# Patient Record
Sex: Male | Born: 1975 | Hispanic: No | Marital: Married | Smoking: Never smoker
Health system: Southern US, Community
[De-identification: ages and names within clinical notes are randomized; demographics above are authoritative.]

## PROBLEM LIST (undated history)

## (undated) DIAGNOSIS — R7303 Prediabetes: Secondary | ICD-10-CM

## (undated) DIAGNOSIS — S99919A Unspecified injury of unspecified ankle, initial encounter: Secondary | ICD-10-CM

## (undated) HISTORY — PX: TONSILLECTOMY: SUR1361

## (undated) HISTORY — DX: Prediabetes: R73.03

## (undated) HISTORY — DX: Unspecified injury of unspecified ankle, initial encounter: S99.919A

---

## 1997-04-30 DIAGNOSIS — S99919A Unspecified injury of unspecified ankle, initial encounter: Secondary | ICD-10-CM

## 1997-04-30 HISTORY — PX: ANKLE SURGERY: SHX546

## 1997-04-30 HISTORY — DX: Unspecified injury of unspecified ankle, initial encounter: S99.919A

## 2016-01-30 ENCOUNTER — Encounter: Payer: Self-pay | Admitting: Emergency Medicine

## 2016-01-30 ENCOUNTER — Ambulatory Visit
Admission: EM | Admit: 2016-01-30 | Discharge: 2016-01-30 | Disposition: A | Discharge status: Home / Self Care | Payer: Self-pay | Attending: Family Medicine | Admitting: Family Medicine

## 2016-01-30 DIAGNOSIS — Z0289 Encounter for other administrative examinations: Secondary | ICD-10-CM

## 2016-01-30 DIAGNOSIS — G4489 Other headache syndrome: Secondary | ICD-10-CM

## 2016-01-30 DIAGNOSIS — R600 Localized edema: Secondary | ICD-10-CM

## 2016-01-30 LAB — CBC WITH DIFFERENTIAL/PLATELET
Basophils Absolute: 0 10*3/uL (ref 0–0.1)
Basophils Relative: 1 %
EOS PCT: 2 %
Eosinophils Absolute: 0.1 10*3/uL (ref 0–0.7)
HCT: 44.3 % (ref 40.0–52.0)
Hemoglobin: 15 g/dL (ref 13.0–18.0)
LYMPHS ABS: 2.3 10*3/uL (ref 1.0–3.6)
LYMPHS PCT: 33 %
MCH: 30 pg (ref 26.0–34.0)
MCHC: 33.9 g/dL (ref 32.0–36.0)
MCV: 88.5 fL (ref 80.0–100.0)
MONO ABS: 0.8 10*3/uL (ref 0.2–1.0)
MONOS PCT: 12 %
Neutro Abs: 3.7 10*3/uL (ref 1.4–6.5)
Neutrophils Relative %: 52 %
PLATELETS: 285 10*3/uL (ref 150–440)
RBC: 5.01 MIL/uL (ref 4.40–5.90)
RDW: 13.5 % (ref 11.5–14.5)
WBC: 7 10*3/uL (ref 3.8–10.6)

## 2016-01-30 LAB — BASIC METABOLIC PANEL
Anion gap: 6 (ref 5–15)
BUN: 15 mg/dL (ref 6–20)
CO2: 24 mmol/L (ref 22–32)
CREATININE: 0.89 mg/dL (ref 0.61–1.24)
Calcium: 9.3 mg/dL (ref 8.9–10.3)
Chloride: 104 mmol/L (ref 101–111)
GFR calc Af Amer: >60 mL/min (ref >60)
GLUCOSE: 123 mg/dL — AB (ref 65–99)
POTASSIUM: 3.9 mmol/L (ref 3.5–5.1)
Sodium: 134 mmol/L — ABNORMAL LOW (ref 135–145)

## 2016-01-30 LAB — DEPT OF TRANSP DIPSTICK, URINE (ARMC ONLY)
Glucose, UA: NEGATIVE mg/dL
HGB URINE DIPSTICK: NEGATIVE
Protein, ur: NEGATIVE mg/dL
Specific Gravity, Urine: 1.02 (ref 1.005–1.030)

## 2016-01-30 NOTE — ED Provider Notes (Signed)
  [facility]    CSN: [id number] Arrival date & time: 01/30/16  1229     History   Chief Complaint Chief Complaint  Patient presents with  . Commercial Driver's License Exam    HPI Caleb Duncan is a 40 y.o. male.   Patient here for DOT Physical (see scanned form)   The history is provided by the patient.    History reviewed. No pertinent past medical history.  There are no active problems to display for this patient.   Past Surgical History:  Procedure Laterality Date  . TONSILLECTOMY         Home Medications    Prior to Admission medications   Not on File    Family History History reviewed. No pertinent family history.  Social History Social History  Substance Use Topics  . Smoking status: Former Games developermoker  . Smokeless tobacco: Never Used  . Alcohol use Yes     Allergies   Review of patient's allergies indicates no known allergies.   Review of Systems Review of Systems   Physical Exam Triage Vital Signs ED Triage Vitals  Enc Vitals Group     BP 01/30/16 1258 138/86     Pulse Rate 01/30/16 1258 70     Resp 01/30/16 1258 18     Temp 01/30/16 1258 97.4 F (36.3 C)     Temp Source 01/30/16 1258 Tympanic     SpO2 01/30/16 1258 99 %     Weight 01/30/16 1259 252 lb (114.3 kg)     Height 01/30/16 1259 5\' 6"  (1.676 m)     Head Circumference --      Peak Flow --      Pain Score 01/30/16 1259 0     Pain Loc --      Pain Edu? --      Excl. in GC? --    No data found.   Updated Vital Signs BP 138/86 (BP Location: Left Arm)   Pulse 70   Temp 97.4 F (36.3 C) (Tympanic)   Resp 18   Ht 5\' 6"  (1.676 m)   Wt 252 lb (114.3 kg)   SpO2 99%   BMI 40.67 kg/m   Visual Acuity Right Eye Distance: 20/13 Left Eye Distance: 20/13 Bilateral Distance: 20/13  Right Eye Near:   Left Eye Near:    Bilateral Near:     Physical Exam   UC Treatments / Results  Labs (all labs ordered are listed, but only abnormal results are  displayed) Labs Reviewed  DEPT OF TRANSP DIPSTICK, URINE([facility] ONLY)    EKG  EKG Interpretation None       Radiology No results found.  Procedures Procedures (including critical care time)  Medications Ordered in UC Medications - No data to display   Initial Impression / Assessment and Plan / UC Course  I have reviewed the triage vital signs and the nursing notes.  Pertinent labs & imaging results that were available during my care of the patient were reviewed by me and considered in my medical decision making (see chart for details).  Clinical Course      Final Clinical Impressions(s) / UC Diagnoses   Final diagnoses:  Encounter for examination required by Department of Transportation (DOT)    New Prescriptions There are no discharge medications for this patient.   DOT Physical (medically qualified for 2 year; see scanned form)   Payton Mccallumrlando Hayleen Clinkscales, MD 01/30/16 1328

## 2016-01-30 NOTE — ED Provider Notes (Signed)
[facility]    CSN: [id number] Arrival date & time: 01/30/16  1014     History   Chief Complaint Chief Complaint  Patient presents with  . Headache    HPI Caleb Duncan is a 40 y.o. male.   40 yo male with a  c/o bilateral ankle swelling off and on for over a week and HA for couple of days. States has a family history of diabetes and high blood pressure. Also states he eats salty foods. Denies any chest pains, shortness of breath, palpitations, vision changes, injuries, numbness/tingling.     Headache    History reviewed. No pertinent past medical history.  There are no active problems to display for this patient.   Past Surgical History:  Procedure Laterality Date  . TONSILLECTOMY         Home Medications    Prior to Admission medications   Not on File    Family History History reviewed. No pertinent family history.  Social History Social History  Substance Use Topics  . Smoking status: Former Games developer  . Smokeless tobacco: Never Used  . Alcohol use Yes     Allergies   Review of patient's allergies indicates no known allergies.   Review of Systems Review of Systems  Neurological: Positive for headaches.     Physical Exam Triage Vital Signs ED Triage Vitals  Enc Vitals Group     BP 01/30/16 1115 138/87     Pulse Rate 01/30/16 1115 70     Resp 01/30/16 1115 16     Temp 01/30/16 1115 97.4 F (36.3 C)     Temp Source 01/30/16 1115 Tympanic     SpO2 01/30/16 1115 99 %     Weight --      Height 01/30/16 1113 5\' 6"  (1.676 m)     Head Circumference --      Peak Flow --      Pain Score 01/30/16 1114 0     Pain Loc --      Pain Edu? --      Excl. in GC? --    No data found.   Updated Vital Signs BP 138/87 (BP Location: Right Arm)   Pulse 70   Temp 97.4 F (36.3 C) (Tympanic)   Resp 16   Ht 5\' 6"  (1.676 m)   SpO2 99%   Visual Acuity Right Eye Distance:   Left Eye Distance:   Bilateral Distance:    Right Eye  Near:   Left Eye Near:    Bilateral Near:     Physical Exam  Constitutional: He appears well-developed and well-nourished. No distress.  HENT:  Head: Normocephalic and atraumatic.  Right Ear: Tympanic membrane, external ear and ear canal normal.  Left Ear: Tympanic membrane, external ear and ear canal normal.  Nose: Nose normal.  Mouth/Throat: Uvula is midline, oropharynx is clear and moist and mucous membranes are normal. No oropharyngeal exudate or tonsillar abscesses.  Eyes: Conjunctivae and EOM are normal. Pupils are equal, round, and reactive to light. Right eye exhibits no discharge. Left eye exhibits no discharge. No scleral icterus.  Neck: Normal range of motion. Neck supple. No tracheal deviation present. No thyromegaly present.  Cardiovascular: Normal rate, regular rhythm and normal heart sounds.   Pulmonary/Chest: Effort normal and breath sounds normal. No stridor. No respiratory distress. He has no wheezes. He has no rales. He exhibits no tenderness.  Musculoskeletal: He exhibits edema (mild, trace-1+ tibial and pedal edema).  Lymphadenopathy:  He has no cervical adenopathy.  Neurological: He is alert.  Skin: Skin is warm and dry. No rash noted. He is not diaphoretic.  Nursing note and vitals reviewed.    UC Treatments / Results  Labs (all labs ordered are listed, but only abnormal results are displayed) Labs Reviewed  BASIC METABOLIC PANEL - Abnormal; Notable for the following:       Result Value   Sodium 134 (*)    Glucose, Bld 123 (*)    All other components within normal limits  CBC WITH DIFFERENTIAL/PLATELET    EKG  EKG Interpretation None       Radiology No results found.  Procedures Procedures (including critical care time)  Medications Ordered in UC Medications - No data to display   Initial Impression / Assessment and Plan / UC Course  I have reviewed the triage vital signs and the nursing notes.  Pertinent labs & imaging results that  were available during my care of the patient were reviewed by me and considered in my medical decision making (see chart for details).  Clinical Course      Final Clinical Impressions(s) / UC Diagnoses   Final diagnoses:  Other headache syndrome  Leg edema    New Prescriptions There are no discharge medications for this patient.  1. Lab results (normal/negative) and diagnosis reviewed with patient 2. Recommend supportive treatment with decrease or elimination of salty foods; increase water intake 3. Establish care with pcp 4. Follow-up prn if symptoms worsen or don't improve   Payton Mccallumrlando Marykathryn Carboni, MD 01/30/16 1426

## 2016-01-30 NOTE — ED Triage Notes (Signed)
Patient c/o bilateral ankle swelling off and on for over a week and HA for couple of days.

## 2016-01-30 NOTE — ED Triage Notes (Signed)
DOT Physical

## 2016-02-01 ENCOUNTER — Telehealth: Payer: Self-pay

## 2016-02-01 NOTE — Telephone Encounter (Signed)
Courtesy call back completed today after patient's visit at [facility]. Patient improved and will call back with any questions or concerns.  

## 2017-08-22 ENCOUNTER — Encounter: Payer: Self-pay | Admitting: Internal Medicine

## 2017-08-23 ENCOUNTER — Encounter: Payer: Self-pay | Admitting: Internal Medicine

## 2017-08-23 ENCOUNTER — Ambulatory Visit: Payer: Managed Care, Other (non HMO) | Admitting: Internal Medicine

## 2017-08-23 VITALS — BP 122/89 | HR 76 | Resp 16 | Ht 65.0 in | Wt 264.0 lb

## 2017-08-23 DIAGNOSIS — R350 Frequency of micturition: Secondary | ICD-10-CM | POA: Diagnosis not present

## 2017-08-23 DIAGNOSIS — M5430 Sciatica, unspecified side: Secondary | ICD-10-CM | POA: Insufficient documentation

## 2017-08-23 DIAGNOSIS — N529 Male erectile dysfunction, unspecified: Secondary | ICD-10-CM | POA: Insufficient documentation

## 2017-08-23 DIAGNOSIS — K219 Gastro-esophageal reflux disease without esophagitis: Secondary | ICD-10-CM | POA: Diagnosis not present

## 2017-08-23 LAB — POCT URINALYSIS DIPSTICK
BILIRUBIN UA: NEGATIVE
GLUCOSE UA: NEGATIVE
KETONES UA: NEGATIVE
Leukocytes, UA: NEGATIVE
Nitrite, UA: NEGATIVE
Protein, UA: NEGATIVE
RBC UA: NEGATIVE
SPEC GRAV UA: 1.025 (ref 1.010–1.025)
Urobilinogen, UA: 0.2 E.U./dL
pH, UA: 6 (ref 5.0–8.0)

## 2017-08-23 MED ORDER — SILDENAFIL CITRATE 20 MG PO TABS: 20.0000 mg | ORAL_TABLET | Freq: Every day | ORAL | 0 refills | Status: DC | PRN

## 2017-08-23 NOTE — Progress Notes (Signed)
Date:  08/23/2017   Name:  Caleb Duncan   DOB:  Jul 16, 1975   MRN:  [id number]   Chief Complaint: Establish Care and Testicle Pain (2-3 mo ago had back pain for few days and testicle pain. )  Had a day and one half of testicular pain and low back pain.  His testicles felt swollen but symptoms resolved without treatment. He concerned about urinary frequency, urgency and mild ED that is intermittent.  No blood in his urine has been seen.  No trauma.  Review of Systems  Constitutional: Negative for chills, fatigue and fever.  Respiratory: Negative for chest tightness, shortness of breath and wheezing.   Cardiovascular: Negative for chest pain and palpitations.  Gastrointestinal: Negative for constipation and diarrhea.       Reflux with fatty or acidic foods   Genitourinary: Positive for frequency, testicular pain and urgency. Negative for difficulty urinating, dysuria, hematuria, penile swelling and scrotal swelling.  Musculoskeletal: Positive for back pain.  Neurological: Negative for dizziness and headaches.  Psychiatric/Behavioral: Negative for sleep disturbance.    There are no active problems to display for this patient.   Prior to Admission medications   Not on File    No Known Allergies  Past Surgical History:  Procedure Laterality Date  . ANKLE SURGERY Right 1999  . TONSILLECTOMY      Social History   Tobacco Use  . Smoking status: Never Smoker  . Smokeless tobacco: Never Used  Substance Use Topics  . Alcohol use: Yes  . Drug use: Yes    Frequency: 1.0 times per week    Types: Marijuana     Medication list has been reviewed and updated.  PHQ 2/9 Scores 08/23/2017  PHQ - 2 Score 0    Physical Exam  Constitutional: He is oriented to person, place, and time. He appears well-developed. No distress.  HENT:  Head: Normocephalic and atraumatic.  Eyes: Pupils are equal, round, and reactive to light.  Neck: Normal range of motion. Neck supple.    Cardiovascular: Normal rate, regular rhythm and normal heart sounds.  Pulmonary/Chest: Effort normal and breath sounds normal. No respiratory distress.  Genitourinary: Rectum normal and prostate normal. Prostate is not tender.  Musculoskeletal: Normal range of motion.  Lymphadenopathy:    He has no cervical adenopathy.  Neurological: He is alert and oriented to person, place, and time.  Skin: Skin is warm and dry. No rash noted.  Psychiatric: He has a normal mood and affect. His behavior is normal. Thought content normal.    BP 122/89   Pulse 76   Resp 16   Ht 5\' 5"  (1.651 m)   Wt 264 lb (119.7 kg)   SpO2 97%   BMI 43.93 kg/m   Assessment and Plan: 1. Gastroesophageal reflux disease, esophagitis presence not specified Continue zantac PRN Avoid fatty foods  2. Urinary frequency UA negative - pt educated about voiding on a schedule - POCT urinalysis dipstick  3. Erectile dysfunction, unspecified erectile dysfunction type Will try sildenafil PRN Consider testosterone level, PSA next visit - sildenafil (REVATIO) 20 MG tablet; Take 1-2 tablets (20-40 mg total) by mouth daily as needed.  Dispense: 30 tablet; Refill: 0  4. Sciatica, unspecified laterality Intermittent - follow up if needed   Meds ordered this encounter  Medications  . sildenafil (REVATIO) 20 MG tablet    Sig: Take 1-2 tablets (20-40 mg total) by mouth daily as needed.    Dispense:  30 tablet    Refill:  0    Partially dictated using Animal nutritionistDragon software. Any errors are unintentional.  Bari EdwardLaura Darletta Noblett, MD [facility]  08/23/2017

## 2017-11-06 ENCOUNTER — Telehealth: Payer: Self-pay | Admitting: General Practice

## 2017-11-06 ENCOUNTER — Other Ambulatory Visit: Payer: Self-pay | Admitting: Internal Medicine

## 2017-11-06 DIAGNOSIS — N529 Male erectile dysfunction, unspecified: Secondary | ICD-10-CM

## 2017-11-06 MED ORDER — SILDENAFIL CITRATE 20 MG PO TABS: 20.0000 mg | ORAL_TABLET | Freq: Every day | ORAL | 5 refills | Status: DC | PRN

## 2017-11-06 NOTE — Telephone Encounter (Signed)
Pt needs refill sent to [facility] [address]    sildenafil (REVATIO) 20 MG tablet [[id number]]

## 2017-12-24 ENCOUNTER — Other Ambulatory Visit: Payer: Self-pay

## 2017-12-24 DIAGNOSIS — N529 Male erectile dysfunction, unspecified: Secondary | ICD-10-CM

## 2017-12-24 MED ORDER — SILDENAFIL CITRATE 20 MG PO TABS: 20.0000 mg | ORAL_TABLET | Freq: Every day | ORAL | 5 refills | Status: DC | PRN

## 2017-12-27 ENCOUNTER — Encounter: Payer: Self-pay | Admitting: Internal Medicine

## 2017-12-27 ENCOUNTER — Ambulatory Visit (INDEPENDENT_AMBULATORY_CARE_PROVIDER_SITE_OTHER): Payer: Managed Care, Other (non HMO) | Admitting: Internal Medicine

## 2017-12-27 VITALS — BP 130/88 | HR 80 | Ht 65.0 in | Wt 244.0 lb

## 2017-12-27 DIAGNOSIS — Z0000 Encounter for general adult medical examination without abnormal findings: Secondary | ICD-10-CM | POA: Diagnosis not present

## 2017-12-27 DIAGNOSIS — Z23 Encounter for immunization: Secondary | ICD-10-CM | POA: Diagnosis not present

## 2017-12-27 DIAGNOSIS — N529 Male erectile dysfunction, unspecified: Secondary | ICD-10-CM

## 2017-12-27 DIAGNOSIS — K219 Gastro-esophageal reflux disease without esophagitis: Secondary | ICD-10-CM | POA: Diagnosis not present

## 2017-12-27 DIAGNOSIS — Z125 Encounter for screening for malignant neoplasm of prostate: Secondary | ICD-10-CM | POA: Diagnosis not present

## 2017-12-27 DIAGNOSIS — N3281 Overactive bladder: Secondary | ICD-10-CM | POA: Diagnosis not present

## 2017-12-27 LAB — POCT URINALYSIS DIPSTICK
BILIRUBIN UA: NEGATIVE
Blood, UA: NEGATIVE
GLUCOSE UA: NEGATIVE
KETONES UA: NEGATIVE
Leukocytes, UA: NEGATIVE
Nitrite, UA: NEGATIVE
Protein, UA: NEGATIVE
SPEC GRAV UA: 1.01 (ref 1.010–1.025)
Urobilinogen, UA: 0.2 E.U./dL
pH, UA: 6 (ref 5.0–8.0)

## 2017-12-27 NOTE — Patient Instructions (Signed)
Tdap Vaccine (Tetanus, Diphtheria and Pertussis): What You Need to Know 1. Why get vaccinated? Tetanus, diphtheria and pertussis are very serious diseases. Tdap vaccine can protect us from these diseases. And, Tdap vaccine given to pregnant women can protect newborn babies against pertussis. TETANUS (Lockjaw) is rare in the [place] today. It causes painful muscle tightening and stiffness, usually all over the body.  It can lead to tightening of muscles in the head and neck so you can't open your mouth, swallow, or sometimes even breathe. Tetanus kills about 1 out of 10 people who are infected even after receiving the best medical care.  DIPHTHERIA is also rare in the [place] today. It can cause a thick coating to form in the back of the throat.  It can lead to breathing problems, heart failure, paralysis, and death.  PERTUSSIS (Whooping Cough) causes severe coughing spells, which can cause difficulty breathing, vomiting and disturbed sleep.  It can also lead to weight loss, incontinence, and rib fractures. Up to 2 in 100 adolescents and 5 in 100 adults with pertussis are hospitalized or have complications, which could include pneumonia or death.  These diseases are caused by bacteria. Diphtheria and pertussis are spread from person to person through secretions from coughing or sneezing. Tetanus enters the body through cuts, scratches, or wounds. Before vaccines, as many as 200,000 cases of diphtheria, 200,000 cases of pertussis, and hundreds of cases of tetanus, were reported in the [place] each year. Since vaccination began, reports of cases for tetanus and diphtheria have dropped by about 99% and for pertussis by about 80%. 2. Tdap vaccine Tdap vaccine can protect adolescents and adults from tetanus, diphtheria, and pertussis. One dose of Tdap is routinely given at age 11 or 12. People who did not get Tdap at that age should get it as soon as possible. Tdap is especially  important for healthcare professionals and anyone having close contact with a baby younger than 12 months. Pregnant women should get a dose of Tdap during every pregnancy, to protect the newborn from pertussis. Infants are most at risk for severe, life-threatening complications from pertussis. Another vaccine, called Td, protects against tetanus and diphtheria, but not pertussis. A Td booster should be given every 10 years. Tdap may be given as one of these boosters if you have never gotten Tdap before. Tdap may also be given after a severe cut or burn to prevent tetanus infection. Your doctor or the person giving you the vaccine can give you more information. Tdap may safely be given at the same time as other vaccines. 3. Some people should not get this vaccine  A person who has ever had a life-threatening allergic reaction after a previous dose of any diphtheria, tetanus or pertussis containing vaccine, OR has a severe allergy to any part of this vaccine, should not get Tdap vaccine. Tell the person giving the vaccine about any severe allergies.  Anyone who had coma or long repeated seizures within 7 days after a childhood dose of DTP or DTaP, or a previous dose of Tdap, should not get Tdap, unless a cause other than the vaccine was found. They can still get Td.  Talk to your doctor if you: ? have seizures or another nervous system problem, ? had severe pain or swelling after any vaccine containing diphtheria, tetanus or pertussis, ? ever had a condition called Guillain-Barr Syndrome (GBS), ? aren't feeling well on the day the shot is scheduled. 4. Risks With any medicine, including   vaccines, there is a chance of side effects. These are usually mild and go away on their own. Serious reactions are also possible but are rare. Most people who get Tdap vaccine do not have any problems with it. Mild problems following Tdap: (Did not interfere with activities)  Pain where the shot was given (about  3 in 4 adolescents or 2 in 3 adults)  Redness or swelling where the shot was given (about 1 person in 5)  Mild fever of at least 100.4F (up to about 1 in 25 adolescents or 1 in 100 adults)  Headache (about 3 or 4 people in 10)  Tiredness (about 1 person in 3 or 4)  Nausea, vomiting, diarrhea, stomach ache (up to 1 in 4 adolescents or 1 in 10 adults)  Chills, sore joints (about 1 person in 10)  Body aches (about 1 person in 3 or 4)  Rash, swollen glands (uncommon)  Moderate problems following Tdap: (Interfered with activities, but did not require medical attention)  Pain where the shot was given (up to 1 in 5 or 6)  Redness or swelling where the shot was given (up to about 1 in 16 adolescents or 1 in 12 adults)  Fever over 102F (about 1 in 100 adolescents or 1 in 250 adults)  Headache (about 1 in 7 adolescents or 1 in 10 adults)  Nausea, vomiting, diarrhea, stomach ache (up to 1 or 3 people in 100)  Swelling of the entire arm where the shot was given (up to about 1 in 500).  Severe problems following Tdap: (Unable to perform usual activities; required medical attention)  Swelling, severe pain, bleeding and redness in the arm where the shot was given (rare).  Problems that could happen after any vaccine:  People sometimes faint after a medical procedure, including vaccination. Sitting or lying down for about 15 minutes can help prevent fainting, and injuries caused by a fall. Tell your doctor if you feel dizzy, or have vision changes or ringing in the ears.  Some people get severe pain in the shoulder and have difficulty moving the arm where a shot was given. This happens very rarely.  Any medication can cause a severe allergic reaction. Such reactions from a vaccine are very rare, estimated at fewer than 1 in a million doses, and would happen within a few minutes to a few hours after the vaccination. As with any medicine, there is a very remote chance of a vaccine  causing a serious injury or death. The safety of vaccines is always being monitored. For more information, visit: www.cdc.gov/vaccinesafety/ 5. What if there is a serious problem? What should I look for? Look for anything that concerns you, such as signs of a severe allergic reaction, very high fever, or unusual behavior. Signs of a severe allergic reaction can include hives, swelling of the face and throat, difficulty breathing, a fast heartbeat, dizziness, and weakness. These would usually start a few minutes to a few hours after the vaccination. What should I do?  If you think it is a severe allergic reaction or other emergency that can't wait, call 9-1-1 or get the person to the nearest hospital. Otherwise, call your doctor.  Afterward, the reaction should be reported to the Vaccine Adverse Event Reporting System (VAERS). Your doctor might file this report, or you can do it yourself through the VAERS web site at www.vaers.hhs.gov, or by calling 1-800-822-7967. ? VAERS does not give medical advice. 6. The National Vaccine Injury Compensation Program The National   Vaccine Injury Compensation Program (VICP) is a federal program that was created to compensate people who may have been injured by certain vaccines. Persons who believe they may have been injured by a vaccine can learn about the program and about filing a claim by calling 1-800-338-2382 or visiting the VICP website at www.hrsa.gov/vaccinecompensation. There is a time limit to file a claim for compensation. 7. How can I learn more?  Ask your doctor. He or she can give you the vaccine package insert or suggest other sources of information.  Call your local or state health department.  Contact the Centers for Disease Control and Prevention (CDC): ? Call 1-800-232-4636 (1-800-CDC-INFO) or ? Visit CDC's website at www.cdc.gov/vaccines CDC Tdap Vaccine VIS (06/23/13) This information is not intended to replace advice given to you by your  health care provider. Make sure you discuss any questions you have with your health care provider. Document Released: 10/16/2011 Document Revised: 01/05/2016 Document Reviewed: 01/05/2016 Elsevier Interactive Patient Education  2017 [employer]  

## 2017-12-27 NOTE — Progress Notes (Signed)
Date:  12/27/2017   Name:  Caleb Duncan   DOB:  06/14/1975   MRN:  [id number]   Chief Complaint: Annual Exam (sudden urge to urinate)  Caleb Duncan is a 42 y.o. male who presents today for his Complete Annual Exam. He feels well. He reports exercising at the gym and working on weight loss. He reports he is sleeping well.  He has some pain intermittently in the anterior right pelvis, worse when he strides forward on that side.  No known injury. No recent reflux since losing some weight. Still having urinary urgency, sometimes with small volume leakage.  He described the urge to urinate and difficulty postponing urination.  Prostate exam done last visit was unremarkable. Sildenafil has helped his ED.  Review of Systems  Constitutional: Negative for appetite change, chills, diaphoresis, fatigue and unexpected weight change.  HENT: Negative for hearing loss, tinnitus, trouble swallowing and voice change.        Snoring  Eyes: Negative for visual disturbance.  Respiratory: Negative for choking, shortness of breath and wheezing.   Cardiovascular: Negative for chest pain, palpitations and leg swelling.  Gastrointestinal: Negative for abdominal pain, blood in stool, constipation and diarrhea.  Genitourinary: Positive for frequency and urgency. Negative for difficulty urinating and dysuria.  Musculoskeletal: Negative for arthralgias, back pain and myalgias.  Skin: Negative for color change and rash.  Allergic/Immunologic: Negative for environmental allergies.  Neurological: Negative for dizziness, syncope and headaches.  Hematological: Negative for adenopathy.  Psychiatric/Behavioral: Negative for dysphoric mood and sleep disturbance.    Patient Active Problem List   Diagnosis Date Noted  . Gastroesophageal reflux disease 08/23/2017  . Urinary frequency 08/23/2017  . Erectile dysfunction 08/23/2017  . Sciatica 08/23/2017    No Known Allergies  Past Surgical History:  Procedure  Laterality Date  . ANKLE SURGERY Right 1999  . TONSILLECTOMY      Social History   Tobacco Use  . Smoking status: Never Smoker  . Smokeless tobacco: Never Used  Substance Use Topics  . Alcohol use: Yes    Alcohol/week: 6.0 standard drinks    Types: 4 Cans of beer, 2 Shots of liquor per week  . Drug use: Yes    Frequency: 1.0 times per week    Types: Marijuana     Medication list has been reviewed and updated.  Current Meds  Medication Sig  . sildenafil (REVATIO) 20 MG tablet Take 1-2 tablets (20-40 mg total) by mouth daily as needed.    PHQ 2/9 Scores 12/27/2017 08/23/2017  PHQ - 2 Score 0 0  PHQ- 9 Score 1 -    Physical Exam  Constitutional: He is oriented to person, place, and time. He appears well-developed and well-nourished.  HENT:  Head: Normocephalic.  Right Ear: Tympanic membrane, external ear and ear canal normal.  Left Ear: Tympanic membrane, external ear and ear canal normal.  Nose: Nose normal.  Mouth/Throat: Uvula is midline and oropharynx is clear and moist.  Eyes: Pupils are equal, round, and reactive to light. Conjunctivae and EOM are normal.  Neck: Normal range of motion. Neck supple. Carotid bruit is not present. No thyromegaly present.  Cardiovascular: Normal rate, regular rhythm, normal heart sounds and intact distal pulses.  Pulmonary/Chest: Effort normal and breath sounds normal. He has no wheezes. Right breast exhibits no mass. Left breast exhibits no mass.  Abdominal: Soft. Normal appearance and bowel sounds are normal. There is no hepatosplenomegaly. There is no tenderness.  Musculoskeletal: Normal range of motion. He  exhibits no edema or tenderness.  Lymphadenopathy:    He has no cervical adenopathy.  Neurological: He is alert and oriented to person, place, and time. He has normal reflexes.  Skin: Skin is warm, dry and intact.  Psychiatric: He has a normal mood and affect. His speech is normal and behavior is normal. Judgment and thought  content normal.  Nursing note and vitals reviewed.   BP 130/88   Pulse 80   Ht 5\' 5"  (1.651 m)   Wt 244 lb (110.7 kg)   BMI 40.60 kg/m   Assessment and Plan: 1. Annual physical exam Continue to work on diet and weight loss with goal to have BMI < 30 - Lipid panel - POCT urinalysis dipstick  2. Gastroesophageal reflux disease, esophagitis presence not specified Improved with change in diet - CBC with Differential/Platelet  3. Erectile dysfunction, unspecified erectile dysfunction type Doing well on sildenafil - pt asks about daily cialis - Comprehensive metabolic panel - Ambulatory referral to Urology  4. Overactive bladder Needs specialist evaluation - Ambulatory referral to Urology  5. Prostate cancer screening DRE normal last visit - PSA  6. Need for diphtheria-tetanus-pertussis (Tdap) vaccine - Tdap vaccine greater than or equal to 7yo IM  No orders of the defined types were placed in this encounter.   Partially dictated using Animal nutritionist. Any errors are unintentional.  Bari Edward, MD [facility]  12/27/2017

## 2017-12-28 ENCOUNTER — Encounter: Payer: Self-pay | Admitting: Internal Medicine

## 2017-12-28 DIAGNOSIS — E782 Mixed hyperlipidemia: Secondary | ICD-10-CM | POA: Insufficient documentation

## 2017-12-28 LAB — CBC WITH DIFFERENTIAL/PLATELET
BASOS ABS: 0 10*3/uL (ref 0.0–0.2)
Basos: 1 %
EOS (ABSOLUTE): 0.1 10*3/uL (ref 0.0–0.4)
EOS: 1 %
HEMOGLOBIN: 15.4 g/dL (ref 13.0–17.7)
Hematocrit: 45.6 % (ref 37.5–51.0)
IMMATURE GRANS (ABS): 0 10*3/uL (ref 0.0–0.1)
Immature Granulocytes: 1 %
LYMPHS: 31 %
Lymphocytes Absolute: 2.6 10*3/uL (ref 0.7–3.1)
MCH: 30.4 pg (ref 26.6–33.0)
MCHC: 33.8 g/dL (ref 31.5–35.7)
MCV: 90 fL (ref 79–97)
MONOCYTES: 12 %
Monocytes Absolute: 1 10*3/uL — ABNORMAL HIGH (ref 0.1–0.9)
NEUTROS ABS: 4.7 10*3/uL (ref 1.4–7.0)
Neutrophils: 54 %
Platelets: 310 10*3/uL (ref 150–450)
RBC: 5.06 x10E6/uL (ref 4.14–5.80)
RDW: 13.4 % (ref 12.3–15.4)
WBC: 8.4 10*3/uL (ref 3.4–10.8)

## 2017-12-28 LAB — COMPREHENSIVE METABOLIC PANEL
ALT: 38 IU/L (ref 0–44)
AST: 23 IU/L (ref 0–40)
Albumin/Globulin Ratio: 1.4 (ref 1.2–2.2)
Albumin: 4.6 g/dL (ref 3.5–5.5)
Alkaline Phosphatase: 59 IU/L (ref 39–117)
BILIRUBIN TOTAL: 0.4 mg/dL (ref 0.0–1.2)
BUN/Creatinine Ratio: 14 (ref 9–20)
BUN: 14 mg/dL (ref 6–24)
CHLORIDE: 99 mmol/L (ref 96–106)
CO2: 24 mmol/L (ref 20–29)
Calcium: 9.7 mg/dL (ref 8.7–10.2)
Creatinine, Ser: 1.01 mg/dL (ref 0.76–1.27)
GFR calc Af Amer: 104 mL/min/{1.73_m2} (ref >59)
GFR calc non Af Amer: 90 mL/min/{1.73_m2} (ref >59)
Globulin, Total: 3.2 g/dL (ref 1.5–4.5)
Glucose: 91 mg/dL (ref 65–99)
POTASSIUM: 4.7 mmol/L (ref 3.5–5.2)
SODIUM: 139 mmol/L (ref 134–144)
Total Protein: 7.8 g/dL (ref 6.0–8.5)

## 2017-12-28 LAB — PSA: Prostate Specific Ag, Serum: 0.4 ng/mL (ref 0.0–4.0)

## 2017-12-28 LAB — LIPID PANEL
Chol/HDL Ratio: 6 ratio — ABNORMAL HIGH (ref 0.0–5.0)
Cholesterol, Total: 239 mg/dL — ABNORMAL HIGH (ref 100–199)
HDL: 40 mg/dL (ref >39)
LDL Calculated: 146 mg/dL — ABNORMAL HIGH (ref 0–99)
TRIGLYCERIDES: 267 mg/dL — AB (ref 0–149)
VLDL Cholesterol Cal: 53 mg/dL — ABNORMAL HIGH (ref 5–40)

## 2018-02-09 ENCOUNTER — Other Ambulatory Visit: Payer: Self-pay

## 2018-02-09 ENCOUNTER — Encounter: Payer: Self-pay | Admitting: Emergency Medicine

## 2018-02-09 ENCOUNTER — Emergency Department: Payer: Managed Care, Other (non HMO)

## 2018-02-09 ENCOUNTER — Emergency Department
Admission: EM | Admit: 2018-02-09 | Discharge: 2018-02-09 | Disposition: A | Discharge status: Home / Self Care | Payer: Managed Care, Other (non HMO) | Attending: Emergency Medicine | Admitting: Emergency Medicine

## 2018-02-09 DIAGNOSIS — F1210 Cannabis abuse, uncomplicated: Secondary | ICD-10-CM | POA: Insufficient documentation

## 2018-02-09 DIAGNOSIS — R071 Chest pain on breathing: Secondary | ICD-10-CM | POA: Diagnosis not present

## 2018-02-09 DIAGNOSIS — R079 Chest pain, unspecified: Secondary | ICD-10-CM

## 2018-02-09 DIAGNOSIS — R0789 Other chest pain: Secondary | ICD-10-CM | POA: Diagnosis not present

## 2018-02-09 LAB — BASIC METABOLIC PANEL
ANION GAP: 9 (ref 5–15)
BUN: 16 mg/dL (ref 6–20)
CO2: 25 mmol/L (ref 22–32)
Calcium: 9.2 mg/dL (ref 8.9–10.3)
Chloride: 104 mmol/L (ref 98–111)
Creatinine, Ser: 1.03 mg/dL (ref 0.61–1.24)
Glucose, Bld: 121 mg/dL — ABNORMAL HIGH (ref 70–99)
Potassium: 4.1 mmol/L (ref 3.5–5.1)
Sodium: 138 mmol/L (ref 135–145)

## 2018-02-09 LAB — CBC
HCT: 45 % (ref 39.0–52.0)
Hemoglobin: 15.2 g/dL (ref 13.0–17.0)
MCH: 30.8 pg (ref 26.0–34.0)
MCHC: 33.8 g/dL (ref 30.0–36.0)
MCV: 91.1 fL (ref 80.0–100.0)
NRBC: 0 % (ref 0.0–0.2)
PLATELETS: 279 10*3/uL (ref 150–400)
RBC: 4.94 MIL/uL (ref 4.22–5.81)
RDW: 12.7 % (ref 11.5–15.5)
WBC: 8.7 10*3/uL (ref 4.0–10.5)

## 2018-02-09 LAB — TROPONIN I

## 2018-02-09 LAB — FIBRIN DERIVATIVES D-DIMER (ARMC ONLY): Fibrin derivatives D-dimer (ARMC): 224.5 ng/mL (FEU) (ref 0.00–499.00)

## 2018-02-09 MED ORDER — IBUPROFEN 800 MG PO TABS
800.0000 mg | ORAL_TABLET | Freq: Once | ORAL | Status: AC
  Administered 2018-02-09: 800 mg via ORAL
  Filled 2018-02-09: qty 1

## 2018-02-09 MED ORDER — CYCLOBENZAPRINE HCL 10 MG PO TABS: 10.0000 mg | ORAL_TABLET | Freq: Three times a day (TID) | ORAL | 0 refills | Status: DC | PRN

## 2018-02-09 MED ORDER — CYCLOBENZAPRINE HCL 10 MG PO TABS
10.0000 mg | ORAL_TABLET | Freq: Once | ORAL | Status: AC
  Administered 2018-02-09: 10 mg via ORAL
  Filled 2018-02-09: qty 1

## 2018-02-09 MED ORDER — OXYCODONE-ACETAMINOPHEN 5-325 MG PO TABS: 1.0000 | ORAL_TABLET | ORAL | 0 refills | Status: DC | PRN

## 2018-02-09 MED ORDER — OXYCODONE-ACETAMINOPHEN 5-325 MG PO TABS
1.0000 | ORAL_TABLET | Freq: Once | ORAL | Status: AC
Start: 2018-02-09 — End: 2018-02-09
  Administered 2018-02-09: 1 via ORAL
  Filled 2018-02-09: qty 1

## 2018-02-09 NOTE — Discharge Instructions (Addendum)
You were evaluated for chest pain, and although no certain cause was found, your exam and evaluation are reassuring in the ER today.  As we discussed, I am most suspicious of muscle spasm.  Take over the counter ibuprofen 800mg  every 8 hours as needed for pain and anti inflammation.  Please see cardiologist and primary care doctor for follow up this week.

## 2018-02-09 NOTE — ED Provider Notes (Signed)
[facility] Emergency Department Provider Note ____________________________________________   I have reviewed the triage vital signs and the triage nursing note.  HISTORY  Chief Complaint Chest Pain   Historian Patient  HPI Caleb Duncan is a 42 y.o. male without any known cardiac history, presents with left-sided top of the shoulder and into the left anterior chest wall pain that started somewhat yesterday but more so this morning.  No known injury or overuse although he does lift and drive for work.  Somewhat worse with deep breath although not really short of breath other than feeling a little short winded secondary to not taking large breaths.  Pain went down the left arm today which is why he presented to urgent care and then was sent here for further evaluation.  Pain 9 out of 10.  Worse with movement.  No nausea or sweats.  States he has done a stress test in the past.     Past Medical History:  Diagnosis Date  . Ankle injury 1999   right    Patient Active Problem List   Diagnosis Date Noted  . Mixed hyperlipidemia 12/28/2017  . Gastroesophageal reflux disease 08/23/2017  . Urinary frequency 08/23/2017  . Erectile dysfunction 08/23/2017  . Sciatica 08/23/2017    Past Surgical History:  Procedure Laterality Date  . ANKLE SURGERY Right 1999  . TONSILLECTOMY      Prior to Admission medications   Medication Sig Start Date End Date Taking? Authorizing Provider  cyclobenzaprine (FLEXERIL) 10 MG tablet Take 1 tablet (10 mg total) by mouth 3 (three) times daily as needed for muscle spasms. 02/09/18   Governor Rooks, MD  oxyCODONE-acetaminophen (PERCOCET) 5-325 MG tablet Take 1 tablet by mouth every 4 (four) hours as needed for severe pain. 02/09/18   Governor Rooks, MD  sildenafil (REVATIO) 20 MG tablet Take 1-2 tablets (20-40 mg total) by mouth daily as needed. 12/24/17   Reubin Milan, MD    No Known Allergies  Family History   Problem Relation Age of Onset  . Diabetes Mother   . Hypertension Father   . Hypertension Brother     Social History Social History   Tobacco Use  . Smoking status: Never Smoker  . Smokeless tobacco: Never Used  Substance Use Topics  . Alcohol use: Yes    Alcohol/week: 6.0 standard drinks    Types: 4 Cans of beer, 2 Shots of liquor per week  . Drug use: Yes    Frequency: 1.0 times per week    Types: Marijuana    Review of Systems  Constitutional: Negative for fever. Eyes: Negative for visual changes. ENT: Negative for sore throat. Cardiovascular: Positive for chest pain. Respiratory: Negative for cough Gastrointestinal: Negative for abdominal pain, vomiting and diarrhea. Genitourinary: Negative for dysuria. Musculoskeletal: Negative for back pain. Skin: Negative for rash. Neurological: Negative for headache.  ____________________________________________   PHYSICAL EXAM:  VITAL SIGNS: ED Triage Vitals  Enc Vitals Group     BP 02/09/18 0952 140/84     Pulse Rate 02/09/18 0951 72     Resp 02/09/18 0951 18     Temp 02/09/18 0951 98.2 F (36.8 C)     Temp Source 02/09/18 0951 Oral     SpO2 02/09/18 0951 99 %     Weight 02/09/18 0951 270 lb (122.5 kg)     Height 02/09/18 0951 5\' 5"  (1.651 m)     Head Circumference --      Peak Flow --  Pain Score 02/09/18 0951 9     Pain Loc --      Pain Edu? --      Excl. in GC? --      Constitutional: Alert and oriented.  HEENT      Head: Normocephalic and atraumatic.      Eyes: Conjunctivae are normal. Pupils equal and round.       Ears:         Nose: No congestion/rhinnorhea.      Mouth/Throat: Mucous membranes are moist.      Neck: No stridor.  Very tight trapezius muscle spasm on the left top of the shoulder. Cardiovascular/Chest: Normal rate, regular rhythm.  No murmurs, rubs, or gallops. Respiratory: Normal respiratory effort without tachypnea nor retractions. Breath sounds are clear and equal bilaterally.  No wheezes/rales/rhonchi. Gastrointestinal: Soft. No distention, no guarding, no rebound. Nontender.    Genitourinary/rectal:Deferred Musculoskeletal: Nontender with normal range of motion in all extremities. No joint effusions.  No lower extremity tenderness.  No edema. Neurologic:  Normal speech and language. No gross or focal neurologic deficits are appreciated. Skin:  Skin is warm, dry and intact. No rash noted. Psychiatric: Mood and affect are normal. Speech and behavior are normal. Patient exhibits appropriate insight and judgment.   ____________________________________________  LABS (pertinent positives/negatives) I, Governor Rooks, MD the attending physician have reviewed the labs noted below.  Labs Reviewed  BASIC METABOLIC PANEL - Abnormal; Notable for the following components:      Result Value   Glucose, Bld 121 (*)    All other components within normal limits  CBC  TROPONIN I  FIBRIN DERIVATIVES D-DIMER ([facility] ONLY)    ____________________________________________    EKG I, Governor Rooks, MD, the attending physician have personally viewed and interpreted all ECGs.  73 bpm.  Normal sinus rhythm.  Narrow QS.  Normal axis.  Nonspecific T wave with T wave in inverted and flat inferiorly and laterally.  No prior EKG for comparison. ____________________________________________  RADIOLOGY   Chest x-ray two-view reviewed by myself interpreted by radiologist: Right middle lobe atelectasis. __________________________________________  PROCEDURES  Procedure(s) performed: None  Procedures  Critical Care performed: None   ____________________________________________  ED COURSE / ASSESSMENT AND PLAN  Pertinent labs & imaging results that were available during my care of the patient were reviewed by me and considered in my medical decision making (see chart for details).     Patient arrived from urgent care with complaint of left chest pain which seems actually more  musculoskeletal and has palpable tender muscle spasm across the upper pack as well as the trapezius on the left side.  Little hypertensive here, patient is in significant pain.  His EKG is nonspecific and there is no prior for comparison.  Laboratory studies are reassuring with negative troponin.  Symptoms have been ongoing well over 4 hours and I do not think a repeat troponin is warranted at this point time.  Patient does not have any hypoxia, tachycardia, or fever or hemoptysis and I think PE is unlikely.  Patient reports some worsening with deep breath, but I think it is from chest wall movement.  In any case we discussed d-dimer and this was negative.  After ibuprofen, Percocet and Flexeril, patient feels much improved.  We discussed recommend follow-up with cardiologist for consideration of outpatient stress testing, recheck blood pressure although repeat here was improved after symptomatic pain relief.  Follow-up with primary care doctor as well.      CONSULTATIONS:   None  Patient / Family / Caregiver informed of clinical course, medical decision-making process, and agree with plan.   I discussed return precautions, follow-up instructions, and discharge instructions with patient and/or family.  Discharge Instructions : You were evaluated for chest pain, and although no certain cause was found, your exam and evaluation are reassuring in the ER today.  As we discussed, I am most suspicious of muscle spasm.  Take over the counter ibuprofen 800mg  every 8 hours as needed for pain and anti inflammation.  Please see cardiologist and primary care doctor for follow up this week.    ___________________________________________   FINAL CLINICAL IMPRESSION(S) / ED DIAGNOSES   Final diagnoses:  Chest wall pain  Nonspecific chest pain      ___________________________________________         Note: This dictation was prepared with Dragon dictation. Any transcriptional  errors that result from this process are unintentional    Governor Rooks, MD 02/09/18 1526

## 2018-02-09 NOTE — ED Triage Notes (Signed)
Here for left sided chest pain since Friday night. Went to urgent care and they told him two leads were abnormal on EKG and come here. Some SHOB. Pain worse with movement, denies injury.  VSS. NAD. Unlabored.

## 2018-02-19 ENCOUNTER — Ambulatory Visit: Payer: Managed Care, Other (non HMO) | Admitting: Urology

## 2018-03-21 ENCOUNTER — Ambulatory Visit (INDEPENDENT_AMBULATORY_CARE_PROVIDER_SITE_OTHER): Payer: Managed Care, Other (non HMO) | Admitting: Urology

## 2018-03-21 ENCOUNTER — Encounter: Payer: Self-pay | Admitting: Urology

## 2018-03-21 VITALS — BP 148/91 | HR 75 | Ht 65.0 in | Wt 267.8 lb

## 2018-03-21 DIAGNOSIS — R350 Frequency of micturition: Secondary | ICD-10-CM

## 2018-03-21 DIAGNOSIS — N5201 Erectile dysfunction due to arterial insufficiency: Secondary | ICD-10-CM

## 2018-03-21 MED ORDER — TADALAFIL 5 MG PO TABS: 5.0000 mg | ORAL_TABLET | Freq: Every day | ORAL | 3 refills | Status: DC | PRN

## 2018-03-21 MED ORDER — TADALAFIL 20 MG PO TABS: 20.0000 mg | ORAL_TABLET | Freq: Every day | ORAL | 3 refills | Status: DC | PRN

## 2018-03-21 NOTE — Progress Notes (Signed)
03/21/2018 3:22 PM   Caleb Duncan 10-16-1975 [id number]  Referring provider: Reubin Milan, MD [address] [place], [place] [address]  Chief Complaint  Patient presents with  . Erectile Dysfunction    HPI: 42 year old male seen in consultation at the request of Dr. Judithann Graves for evaluation of erectile dysfunction.  He presents with a 1-1.5-year history of erectile dysfunction.  He has partial erections which are occasionally firm enough for penetration.  He denies pain or curvature with erections.  He was started on generic sildenafil and takes 1 to 2 tablets which does give him erection firm enough for penetration.  On occasions however he is not able to have intercourse after taking the medication until several hours and states the medication has worn off by that time.  He has mild decreased libido and some tiredness and fatigue.  A testosterone level has not been checked.  Organic risk factors include hyperlipidemia.  Denies history of tobacco use, diabetes, hypertension.  He does have urinary frequency and urgency but states his symptoms are not bothersome enough that he desires management at this time.  A PSA August 2019 was 0.4.   PMH: Past Medical History:  Diagnosis Date  . Ankle injury 1999   right    Surgical History: Past Surgical History:  Procedure Laterality Date  . ANKLE SURGERY Right 1999  . TONSILLECTOMY      Home Medications:  Allergies as of 03/21/2018   No Known Allergies     Medication List        Accurate as of 03/21/18  3:22 PM. Always use your most recent med list.          cyclobenzaprine 10 MG tablet Commonly known as:  FLEXERIL Take 1 tablet (10 mg total) by mouth 3 (three) times daily as needed for muscle spasms.   oxyCODONE-acetaminophen 5-325 MG tablet Commonly known as:  PERCOCET/ROXICET Take 1 tablet by mouth every 4 (four) hours as needed for severe pain.   sildenafil 20 MG tablet Commonly known as:   REVATIO Take 1-2 tablets (20-40 mg total) by mouth daily as needed.       Allergies: No Known Allergies  Family History: Family History  Problem Relation Age of Onset  . Diabetes Mother   . Hypertension Father   . Hypertension Brother     Social History:  reports that he has never smoked. He has never used smokeless tobacco. He reports that he drinks about 6.0 standard drinks of alcohol per week. He reports that he has current or past drug history. Drug: Marijuana. Frequency: 1.00 time per week.  ROS: UROLOGY Frequent Urination?: Yes Hard to postpone urination?: No Burning/pain with urination?: No Get up at night to urinate?: Yes Leakage of urine?: Yes Urine stream starts and stops?: No Trouble starting stream?: No Do you have to strain to urinate?: No Blood in urine?: No Urinary tract infection?: No Sexually transmitted disease?: No Injury to kidneys or bladder?: No Painful intercourse?: No Weak stream?: No Erection problems?: No Penile pain?: No  Gastrointestinal Nausea?: No Vomiting?: No Indigestion/heartburn?: No Diarrhea?: No Constipation?: No  Constitutional Fever: No Night sweats?: No Weight loss?: No Fatigue?: No  Skin Skin rash/lesions?: No Itching?: No  Eyes Blurred vision?: No Double vision?: No  Ears/Nose/Throat Sore throat?: No Sinus problems?: No  Hematologic/Lymphatic Swollen glands?: No Easy bruising?: No  Cardiovascular Leg swelling?: No Chest pain?: No  Respiratory Cough?: No Shortness of breath?: No  Endocrine Excessive thirst?: No  Musculoskeletal  Back pain?: No Joint pain?: No  Neurological Headaches?: No Dizziness?: No  Psychologic Depression?: No Anxiety?: No  Physical Exam: BP (!) 148/91 (BP Location: Left Arm, Patient Position: Sitting, Cuff Size: Large)   Pulse 75   Ht 5\' 5"  (1.651 m)   Wt 267 lb 12.8 oz (121.5 kg)   BMI 44.56 kg/m   Constitutional:  Alert and oriented, No acute distress. HEENT:  [place] AT, moist mucus membranes.  Trachea midline, no masses. Cardiovascular: No clubbing, cyanosis, or edema. Respiratory: Normal respiratory effort, no increased work of breathing. GI: Abdomen is soft, nontender, nondistended, no abdominal masses GU: No CVA tenderness.  Penis without lesions, testes descended bilaterally without masses or tenderness, epididymes palpably normal.  Prostate 30 g, smooth without nodules. Lymph: No cervical or inguinal lymphadenopathy. Skin: No rashes, bruises or suspicious lesions. Neurologic: Grossly intact, no focal deficits, moving all 4 extremities. Psychiatric: Normal mood and affect.   Assessment & Plan:   42 year old male with erectile dysfunction.  He has no significant organic risk factors.  We will check an a.m. testosterone level, LH and prolactin.  He was given an Rx for tadalafil due to its longer half-life.  He will be notified with his lab results and further recommendations.   Riki AltesScott C Kendrick Remigio, MD  [facility] [address], [address] [place], [place] [address] [phone]

## 2018-03-23 ENCOUNTER — Encounter: Payer: Self-pay | Admitting: Urology

## 2018-04-02 ENCOUNTER — Other Ambulatory Visit: Payer: Managed Care, Other (non HMO)

## 2018-04-02 ENCOUNTER — Encounter: Payer: Self-pay | Admitting: Urology

## 2018-09-18 ENCOUNTER — Other Ambulatory Visit: Payer: Self-pay | Admitting: Urology

## 2018-09-18 MED ORDER — TADALAFIL 20 MG PO TABS: 20.0000 mg | ORAL_TABLET | Freq: Every day | ORAL | 3 refills | Status: DC | PRN

## 2018-09-18 NOTE — Telephone Encounter (Signed)
Left patient a VM refill sent in to his pharmacy as requested.

## 2018-09-18 NOTE — Telephone Encounter (Signed)
Patient called the office today requesting a refill of Tadalafil/Cialis to be sent to the [facility] on [address].

## 2018-09-23 ENCOUNTER — Telehealth: Payer: Self-pay

## 2018-09-23 NOTE — Telephone Encounter (Signed)
See my chart message

## 2018-09-25 ENCOUNTER — Telehealth: Payer: Self-pay | Admitting: *Deleted

## 2018-09-25 NOTE — Telephone Encounter (Signed)
Received message from pharmacy regarding refill for Tadalafil-request Sildenafil due to being cheaper for patient.

## 2018-09-26 ENCOUNTER — Telehealth: Payer: Self-pay

## 2018-09-26 NOTE — Telephone Encounter (Signed)
I do not even prescribe this medication for him.  He needs to call Urology.

## 2018-09-26 NOTE — Telephone Encounter (Signed)
Patient wife called saying tadalafil is too expensive and he needs sadinafil instead. She said he never been able to take the Cialis because its too expensive. Needs the other sent in place of this.  Please Advise.  [facility]

## 2018-09-29 ENCOUNTER — Telehealth: Payer: Self-pay | Admitting: Urology

## 2018-09-29 DIAGNOSIS — N5201 Erectile dysfunction due to arterial insufficiency: Secondary | ICD-10-CM

## 2018-09-29 MED ORDER — SILDENAFIL CITRATE 20 MG PO TABS: ORAL_TABLET | ORAL | 3 refills | Status: DC

## 2018-09-29 NOTE — Telephone Encounter (Signed)
Patient wife informed

## 2018-09-29 NOTE — Telephone Encounter (Signed)
Called pt's wife who states that the patient has never tried tadalafil despite it being sent into the pharmacy multiple times. She states that he would just like to continue Sildenafil. Rx sent.

## 2018-09-29 NOTE — Telephone Encounter (Signed)
Pt's wife called and states that the wrong meds were called in. She states that it was supposed to be Sildenafil not Tildenafil. Please advise.

## 2018-09-29 NOTE — Telephone Encounter (Signed)
If the patient desires to continue tadalafil he can get it for around $1/pill at [facility] in [place] with a good Rx coupon

## 2018-12-31 ENCOUNTER — Encounter: Payer: Managed Care, Other (non HMO) | Admitting: Internal Medicine

## 2018-12-31 NOTE — Progress Notes (Deleted)
Date:  12/31/2018   Name:  Caleb Duncan   DOB:  26-Aug-1975   MRN:  [id number]   Chief Complaint: No chief complaint on file. Caleb Duncan is a 43 y.o. male who presents today for his Complete Annual Exam. He feels {DESC; WELL/FAIRLY WELL/POORLY:18703}. He reports exercising ***. He reports he is sleeping {DESC; WELL/FAIRLY WELL/POORLY:18703}.   Colonoscopy - none Tdap 11/2017  HPI  Review of Systems  Constitutional: Negative for appetite change, chills, diaphoresis, fatigue and unexpected weight change.  HENT: Negative for hearing loss, tinnitus, trouble swallowing and voice change.   Eyes: Negative for visual disturbance.  Respiratory: Negative for choking, shortness of breath and wheezing.   Cardiovascular: Negative for chest pain, palpitations and leg swelling.  Gastrointestinal: Negative for abdominal pain, blood in stool, constipation and diarrhea.  Genitourinary: Negative for difficulty urinating, dysuria and frequency.  Musculoskeletal: Negative for arthralgias, back pain and myalgias.  Skin: Negative for color change and rash.  Neurological: Negative for dizziness, syncope and headaches.  Hematological: Negative for adenopathy.  Psychiatric/Behavioral: Negative for dysphoric mood and sleep disturbance.    Patient Active Problem List   Diagnosis Date Noted  . Mixed hyperlipidemia 12/28/2017  . Gastroesophageal reflux disease 08/23/2017  . Urinary frequency 08/23/2017  . Erectile dysfunction 08/23/2017  . Sciatica 08/23/2017    No Known Allergies  Past Surgical History:  Procedure Laterality Date  . ANKLE SURGERY Right 1999  . TONSILLECTOMY      Social History   Tobacco Use  . Smoking status: Never Smoker  . Smokeless tobacco: Never Used  Substance Use Topics  . Alcohol use: Yes    Alcohol/week: 6.0 standard drinks    Types: 4 Cans of beer, 2 Shots of liquor per week  . Drug use: Yes    Frequency: 1.0 times per week    Types: Marijuana      Medication list has been reviewed and updated.  No outpatient medications have been marked as taking for the 12/31/18 encounter (Appointment) with Glean Hess, MD.    [facility] 2/9 Scores 12/27/2017 08/23/2017  PHQ - 2 Score 0 0  PHQ- 9 Score 1 -    BP Readings from Last 3 Encounters:  03/21/18 (!) 148/91  02/09/18 128/69  12/27/17 130/88    Physical Exam Vitals signs and nursing note reviewed.  Constitutional:      Appearance: Normal appearance. He is well-developed.  HENT:     Head: Normocephalic.     Right Ear: Tympanic membrane, ear canal and external ear normal.     Left Ear: Tympanic membrane, ear canal and external ear normal.     Nose: Nose normal.     Mouth/Throat:     Pharynx: Uvula midline.  Eyes:     Conjunctiva/sclera: Conjunctivae normal.     Pupils: Pupils are equal, round, and reactive to light.  Neck:     Musculoskeletal: Normal range of motion and neck supple.     Thyroid: No thyromegaly.     Vascular: No carotid bruit.  Cardiovascular:     Rate and Rhythm: Normal rate and regular rhythm.     Heart sounds: Normal heart sounds.  Pulmonary:     Effort: Pulmonary effort is normal.     Breath sounds: Normal breath sounds. No wheezing.  Chest:     Breasts:        Right: No mass.        Left: No mass.  Abdominal:     General: Bowel  sounds are normal.     Palpations: Abdomen is soft.     Tenderness: There is no abdominal tenderness.  Musculoskeletal: Normal range of motion.  Lymphadenopathy:     Cervical: No cervical adenopathy.  Skin:    General: Skin is warm and dry.  Neurological:     Mental Status: He is alert and oriented to person, place, and time.     Deep Tendon Reflexes: Reflexes are normal and symmetric.  Psychiatric:        Speech: Speech normal.        Behavior: Behavior normal.        Thought Content: Thought content normal.        Judgment: Judgment normal.     Wt Readings from Last 3 Encounters:  03/21/18 267 lb 12.8 oz (121.5  kg)  02/09/18 270 lb (122.5 kg)  12/27/17 244 lb (110.7 kg)    There were no vitals taken for this visit.  Assessment and Plan:

## 2019-02-03 ENCOUNTER — Telehealth: Payer: Self-pay | Admitting: Urology

## 2019-02-03 DIAGNOSIS — N5201 Erectile dysfunction due to arterial insufficiency: Secondary | ICD-10-CM

## 2019-02-03 MED ORDER — SILDENAFIL CITRATE 20 MG PO TABS: ORAL_TABLET | ORAL | 3 refills | Status: DC

## 2019-02-03 NOTE — Telephone Encounter (Signed)
RX sent

## 2019-02-03 NOTE — Telephone Encounter (Signed)
Pt needs refill for Sildenafil sent to [facility] on [address]

## 2019-03-19 ENCOUNTER — Other Ambulatory Visit: Payer: Self-pay | Admitting: Urology

## 2019-03-19 DIAGNOSIS — N5201 Erectile dysfunction due to arterial insufficiency: Secondary | ICD-10-CM

## 2019-03-24 ENCOUNTER — Ambulatory Visit: Payer: Managed Care, Other (non HMO) | Admitting: Internal Medicine

## 2019-03-24 ENCOUNTER — Encounter: Payer: Self-pay | Admitting: Internal Medicine

## 2019-03-24 ENCOUNTER — Other Ambulatory Visit: Payer: Self-pay

## 2019-03-24 ENCOUNTER — Ambulatory Visit
Admission: RE | Admit: 2019-03-24 | Discharge: 2019-03-24 | Disposition: A | Discharge status: Home / Self Care | Payer: Managed Care, Other (non HMO) | Source: Ambulatory Visit | Attending: Internal Medicine | Admitting: Internal Medicine

## 2019-03-24 ENCOUNTER — Other Ambulatory Visit
Admission: RE | Admit: 2019-03-24 | Discharge: 2019-03-24 | Disposition: A | Discharge status: Home / Self Care | Payer: Managed Care, Other (non HMO) | Source: Home / Self Care | Attending: Internal Medicine | Admitting: Internal Medicine

## 2019-03-24 ENCOUNTER — Ambulatory Visit
Admission: RE | Admit: 2019-03-24 | Discharge: 2019-03-24 | Disposition: A | Discharge status: Home / Self Care | Payer: Managed Care, Other (non HMO) | Attending: Internal Medicine | Admitting: Internal Medicine

## 2019-03-24 VITALS — BP 140/84 | HR 84 | Ht 65.0 in | Wt 278.0 lb

## 2019-03-24 DIAGNOSIS — M25542 Pain in joints of left hand: Secondary | ICD-10-CM

## 2019-03-24 DIAGNOSIS — M25541 Pain in joints of right hand: Secondary | ICD-10-CM

## 2019-03-24 DIAGNOSIS — Z23 Encounter for immunization: Secondary | ICD-10-CM | POA: Diagnosis not present

## 2019-03-24 DIAGNOSIS — N5201 Erectile dysfunction due to arterial insufficiency: Secondary | ICD-10-CM

## 2019-03-24 DIAGNOSIS — M25562 Pain in left knee: Secondary | ICD-10-CM

## 2019-03-24 DIAGNOSIS — M25561 Pain in right knee: Secondary | ICD-10-CM

## 2019-03-24 LAB — COMPREHENSIVE METABOLIC PANEL
ALT: 34 U/L (ref 0–44)
AST: 22 U/L (ref 15–41)
Albumin: 4.2 g/dL (ref 3.5–5.0)
Alkaline Phosphatase: 60 U/L (ref 38–126)
Anion gap: 7 (ref 5–15)
BUN: 17 mg/dL (ref 6–20)
CO2: 25 mmol/L (ref 22–32)
Calcium: 8.9 mg/dL (ref 8.9–10.3)
Chloride: 103 mmol/L (ref 98–111)
Creatinine, Ser: 1.03 mg/dL (ref 0.61–1.24)
GFR calc Af Amer: >60 mL/min (ref >60)
GFR calc non Af Amer: >60 mL/min (ref >60)
Glucose, Bld: 117 mg/dL — ABNORMAL HIGH (ref 70–99)
Potassium: 3.7 mmol/L (ref 3.5–5.1)
Sodium: 135 mmol/L (ref 135–145)
Total Bilirubin: 0.4 mg/dL (ref 0.3–1.2)
Total Protein: 7.6 g/dL (ref 6.5–8.1)

## 2019-03-24 LAB — URIC ACID: Uric Acid, Serum: 8.3 mg/dL (ref 3.7–8.6)

## 2019-03-24 LAB — CBC WITH DIFFERENTIAL/PLATELET
Abs Immature Granulocytes: 0.03 10*3/uL (ref 0.00–0.07)
Basophils Absolute: 0 10*3/uL (ref 0.0–0.1)
Basophils Relative: 0 %
Eosinophils Absolute: 0.1 10*3/uL (ref 0.0–0.5)
Eosinophils Relative: 1 %
HCT: 42.3 % (ref 39.0–52.0)
Hemoglobin: 14.6 g/dL (ref 13.0–17.0)
Immature Granulocytes: 0 %
Lymphocytes Relative: 28 %
Lymphs Abs: 2.4 10*3/uL (ref 0.7–4.0)
MCH: 31.1 pg (ref 26.0–34.0)
MCHC: 34.5 g/dL (ref 30.0–36.0)
MCV: 90 fL (ref 80.0–100.0)
Monocytes Absolute: 0.9 10*3/uL (ref 0.1–1.0)
Monocytes Relative: 10 %
Neutro Abs: 5.2 10*3/uL (ref 1.7–7.7)
Neutrophils Relative %: 61 %
Platelets: 308 10*3/uL (ref 150–400)
RBC: 4.7 MIL/uL (ref 4.22–5.81)
RDW: 12.5 % (ref 11.5–15.5)
WBC: 8.6 10*3/uL (ref 4.0–10.5)
nRBC: 0 % (ref 0.0–0.2)

## 2019-03-24 LAB — SEDIMENTATION RATE: Sed Rate: 5 mm/hr (ref 0–15)

## 2019-03-24 MED ORDER — SILDENAFIL CITRATE 20 MG PO TABS: ORAL_TABLET | ORAL | 3 refills | Status: DC

## 2019-03-24 MED ORDER — PREDNISONE 10 MG PO TABS: 10.0000 mg | ORAL_TABLET | ORAL | 0 refills | Status: AC

## 2019-03-24 NOTE — Progress Notes (Signed)
Date:  03/24/2019   Name:  Caleb Duncan   DOB:  [birth year]   MRN:  [id number]   Chief Complaint: Hand Pain (X 1 month- woke up with his two middles fingers swollen. Last week noticed it shifts in the right hands and now has swelling on right hand. In the mornings its worse. ), Knee Pain (Last week started having knee pain. On and off. 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 ), and Immunizations (Flu shot.)  Hand Pain  Incident onset: no incident - started about one month ago. There was no injury mechanism. The pain is present in the left hand (worsening and now occuring in right hand). The quality of the pain is described as aching and cramping (very stiff in AM then feels better as the day goes on). The pain does not radiate. The pain is mild. The pain has been fluctuating since the incident. Pertinent negatives include no chest pain. The symptoms are aggravated by movement. He has tried nothing for the symptoms.  Knee Pain  There was no injury mechanism (but played football when younger). The pain is present in the left knee and right knee. The quality of the pain is described as aching. The pain has been intermittent (no swelling, redness or warmth - just catching pain in knees at times when walking) since onset. The symptoms are aggravated by weight bearing. He has tried nothing for the symptoms.    Lab Results  Component Value Date   CREATININE 1.03 02/09/2018   BUN 16 02/09/2018   NA 138 02/09/2018   K 4.1 02/09/2018   CL 104 02/09/2018   CO2 25 02/09/2018   Lab Results  Component Value Date   CHOL 239 (H) 12/27/2017   HDL 40 12/27/2017   LDLCALC 146 (H) 12/27/2017   TRIG 267 (H) 12/27/2017   CHOLHDL 6.0 (H) 12/27/2017   No results found for: TSH No results found for: HGBA1C   Review of Systems  Constitutional: Negative for chills, fatigue and fever.  Respiratory: Negative for chest tightness and shortness of breath.    Cardiovascular: Negative for chest pain and palpitations.  Musculoskeletal: Positive for arthralgias, gait problem and joint swelling.  Skin: Negative for rash.  Neurological: Negative for dizziness and headaches.  Psychiatric/Behavioral: Negative for sleep disturbance. The patient is not nervous/anxious.     Patient Active Problem List   Diagnosis Date Noted  . Mixed hyperlipidemia 12/28/2017  . Gastroesophageal reflux disease 08/23/2017  . Urinary frequency 08/23/2017  . Erectile dysfunction 08/23/2017  . Sciatica 08/23/2017    No Known Allergies  Past Surgical History:  Procedure Laterality Date  . ANKLE SURGERY Right 1999  . TONSILLECTOMY      Social History   Tobacco Use  . Smoking status: Never Smoker  . Smokeless tobacco: Never Used  Substance Use Topics  . Alcohol use: Yes    Alcohol/week: 6.0 standard drinks    Types: 4 Cans of beer, 2 Shots of liquor per week  . Drug use: Yes    Frequency: 1.0 times per week    Types: Marijuana     Medication list has been reviewed and updated.  No outpatient medications have been marked as taking for the 03/24/19 encounter (Office Visit) with Reubin Milan, MD.    [facility] 2/9 Scores 03/24/2019 12/27/2017 08/23/2017  PHQ - 2 Score 0 0 0  PHQ- 9 Score - 1 -    BP Readings  from Last 3 Encounters:  03/24/19 140/84  03/21/18 (!) 148/91  02/09/18 128/69    Physical Exam Vitals signs and nursing note reviewed.  Constitutional:      General: He is not in acute distress.    Appearance: He is well-developed.  HENT:     Head: Normocephalic and atraumatic.  Neck:     Musculoskeletal: Normal range of motion.  Cardiovascular:     Rate and Rhythm: Normal rate and regular rhythm.     Pulses: Normal pulses.     Heart sounds: No murmur.  Pulmonary:     Effort: Pulmonary effort is normal. No respiratory distress.     Breath sounds: No wheezing or rhonchi.  Musculoskeletal:     Right wrist: Normal.     Left wrist:  Normal.     Right knee: He exhibits normal range of motion, no swelling and no effusion. No tenderness found.     Left knee: He exhibits normal range of motion, no swelling and no effusion. No tenderness found.     Comments: 3rd and 4th fingers on left hand slightly swollen with tenderness of the DIP and PIP joints MCPs minimally tender with mild synovitis. Right hand - index and middle finger mildly swollen and tender.   Lymphadenopathy:     Cervical: No cervical adenopathy.  Skin:    General: Skin is warm and dry.     Findings: No rash.  Neurological:     Mental Status: He is alert and oriented to person, place, and time.  Psychiatric:        Behavior: Behavior normal.        Thought Content: Thought content normal.     Wt Readings from Last 3 Encounters:  03/24/19 278 lb (126.1 kg)  03/21/18 267 lb 12.8 oz (121.5 kg)  02/09/18 270 lb (122.5 kg)    BP 140/84   Pulse 84   Ht 5\' 5"  (1.651 m)   Wt 278 lb (126.1 kg)   SpO2 98%   BMI 46.26 kg/m   Assessment and Plan: 1. Arthralgia of both hands Concern for inflammatory arthritis Treat with prednisone taper and obtain labs and xray - Sedimentation rate - Rheumatoid Factor - ANA w/Reflex if Positive - Uric acid - Comprehensive metabolic panel - DG Hand Complete Left; Future - predniSONE (DELTASONE) 10 MG tablet; Take 1 tablet (10 mg total) by mouth as directed for 6 days. Take 6,5,4,3,2,1 then stop  Dispense: 21 tablet; Refill: 0  2. Pain in both knees, unspecified chronicity Likely OA - no acute abnormality today  - CBC with Differential/Platelet  3. Erectile dysfunction due to arterial insufficiency - sildenafil (REVATIO) 20 MG tablet; Tale 1-2 tablets by mouth daily as needed  Dispense: 30 tablet; Refill: 3   Partially dictated using Editor, commissioning. Any errors are unintentional.  Halina Maidens, MD [facility]  03/24/2019

## 2019-03-25 LAB — RHEUMATOID FACTOR: Rheumatoid fact SerPl-aCnc: <10 IU/mL (ref 0.0–13.9)

## 2019-03-27 LAB — ANA W/REFLEX IF POSITIVE: Anti Nuclear Antibody (ANA): NEGATIVE

## 2019-04-20 ENCOUNTER — Other Ambulatory Visit: Payer: Self-pay | Admitting: Internal Medicine

## 2019-04-20 DIAGNOSIS — N5201 Erectile dysfunction due to arterial insufficiency: Secondary | ICD-10-CM

## 2019-05-07 ENCOUNTER — Other Ambulatory Visit: Payer: Self-pay | Admitting: Internal Medicine

## 2019-05-07 DIAGNOSIS — N5201 Erectile dysfunction due to arterial insufficiency: Secondary | ICD-10-CM

## 2019-05-14 ENCOUNTER — Other Ambulatory Visit: Payer: Self-pay | Admitting: Internal Medicine

## 2019-05-14 DIAGNOSIS — N5201 Erectile dysfunction due to arterial insufficiency: Secondary | ICD-10-CM

## 2019-06-15 ENCOUNTER — Ambulatory Visit: Payer: Managed Care, Other (non HMO) | Admitting: Internal Medicine

## 2019-06-16 ENCOUNTER — Ambulatory Visit: Payer: Managed Care, Other (non HMO) | Admitting: Internal Medicine

## 2019-06-30 ENCOUNTER — Ambulatory Visit: Payer: Managed Care, Other (non HMO) | Admitting: Internal Medicine

## 2019-06-30 ENCOUNTER — Encounter: Payer: Self-pay | Admitting: Internal Medicine

## 2019-06-30 ENCOUNTER — Other Ambulatory Visit: Payer: Self-pay

## 2019-06-30 VITALS — BP 116/84 | HR 83 | Temp 97.9°F | Ht 65.0 in | Wt 278.0 lb

## 2019-06-30 DIAGNOSIS — M130 Polyarthritis, unspecified: Secondary | ICD-10-CM

## 2019-06-30 DIAGNOSIS — R739 Hyperglycemia, unspecified: Secondary | ICD-10-CM | POA: Diagnosis not present

## 2019-06-30 NOTE — Progress Notes (Signed)
Date:  06/30/2019   Name:  Caleb Duncan   DOB:  02-15-1976   MRN:  [id number]   Chief Complaint: Edema (Swelling in feet and hands. Started months ago on and off. Hands become stiff when they swell and can be painful. Ankles down swelling. Not the leg. )  Hand Pain  There was no injury mechanism. The pain is present in the right hand and left hand. The quality of the pain is described as aching (and swelling). The pain does not radiate. The pain is mild. The pain has been constant since the incident. Pertinent negatives include no chest pain. The symptoms are aggravated by movement. Treatments tried: prednisone helped briefly. Improvement on treatment: much better on the second day but returned quickly once taper was completed.  he also has similar symptoms now in his feet and ankles.  He not tried nsaids for either hand or foot pain. He takes no supplements or other medications. He denies history of tick bites, rash or psoriasis. Previous workup included hand films and negative ANA, Sed rate and RF.  Lab Results  Component Value Date   CREATININE 1.03 03/24/2019   BUN 17 03/24/2019   NA 135 03/24/2019   K 3.7 03/24/2019   CL 103 03/24/2019   CO2 25 03/24/2019   Lab Results  Component Value Date   CHOL 239 (H) 12/27/2017   HDL 40 12/27/2017   LDLCALC 146 (H) 12/27/2017   TRIG 267 (H) 12/27/2017   CHOLHDL 6.0 (H) 12/27/2017   No results found for: TSH No results found for: HGBA1C   Review of Systems  Constitutional: Positive for unexpected weight change (35 lbs in past 18 mo). Negative for appetite change, diaphoresis and fatigue.  Eyes: Negative for visual disturbance.  Respiratory: Negative for cough, chest tightness, shortness of breath and wheezing.   Cardiovascular: Negative for chest pain and palpitations.  Genitourinary: Positive for frequency. Negative for difficulty urinating.  Musculoskeletal: Positive for arthralgias and joint swelling.  Skin: Negative for  rash.  Neurological: Negative for dizziness and headaches.  Psychiatric/Behavioral: Negative for dysphoric mood and sleep disturbance. The patient is not nervous/anxious.     Patient Active Problem List   Diagnosis Date Noted  . Mixed hyperlipidemia 12/28/2017  . Gastroesophageal reflux disease 08/23/2017  . Urinary frequency 08/23/2017  . Erectile dysfunction 08/23/2017  . Sciatica 08/23/2017    No Known Allergies  Past Surgical History:  Procedure Laterality Date  . ANKLE SURGERY Right 1999  . TONSILLECTOMY      Social History   Tobacco Use  . Smoking status: Never Smoker  . Smokeless tobacco: Never Used  Substance Use Topics  . Alcohol use: Yes    Alcohol/week: 6.0 standard drinks    Types: 4 Cans of beer, 2 Shots of liquor per week  . Drug use: Yes    Frequency: 1.0 times per week    Types: Marijuana     Medication list has been reviewed and updated.  Current Meds  Medication Sig  . sildenafil (REVATIO) 20 MG tablet TALE 1-2 TABLETS BY MOUTH DAILY AS NEEDED    PHQ 2/9 Scores 06/30/2019 03/24/2019 12/27/2017 08/23/2017  PHQ - 2 Score 0 0 0 0  PHQ- 9 Score - - 1 -    BP Readings from Last 3 Encounters:  06/30/19 116/84  03/24/19 140/84  03/21/18 (!) 148/91    Physical Exam Vitals and nursing note reviewed.  Constitutional:      General: He is not in  acute distress.    Appearance: Normal appearance. He is well-developed.  HENT:     Head: Normocephalic and atraumatic.  Cardiovascular:     Rate and Rhythm: Normal rate and regular rhythm.     Pulses: Normal pulses.     Heart sounds: No murmur.  Pulmonary:     Effort: Pulmonary effort is normal. No respiratory distress.     Breath sounds: No wheezing or rhonchi.  Musculoskeletal:     Cervical back: Normal range of motion.     Comments: Tender PIP joints both hands Mild soft tissue swelling - non specific No definite swelling or tenderness in both ankles/feet  Skin:    General: Skin is warm and dry.       Findings: No rash.  Neurological:     Mental Status: He is alert and oriented to person, place, and time.  Psychiatric:        Behavior: Behavior normal.        Thought Content: Thought content normal.     Wt Readings from Last 3 Encounters:  06/30/19 278 lb (126.1 kg)  03/24/19 278 lb (126.1 kg)  03/21/18 267 lb 12.8 oz (121.5 kg)    BP 116/84   Pulse 83   Temp 97.9 F (36.6 C) (Oral)   Ht 5\' 5"  (1.651 m)   Wt 278 lb (126.1 kg)   SpO2 96%   BMI 46.26 kg/m   Assessment and Plan: 1. Polyarthritis Previous negative labs but with significant response to prednisone Will check CCP and refer to Rheumatology - CYCLIC CITRUL PEPTIDE ANTIBODY, IGG/IGA - Ambulatory referral to Rheumatology  2. Hyperglycemia Noted on last labs - he complains of excess urination but no weight loss Will check A1C to rule out DM - Hemoglobin A1c   Partially dictated using Editor, commissioning. Any errors are unintentional.  Halina Maidens, MD [facility]  06/30/2019

## 2019-07-02 LAB — CYCLIC CITRUL PEPTIDE ANTIBODY, IGG/IGA: Cyclic Citrullin Peptide Ab: 18 units (ref 0–19)

## 2019-07-02 LAB — HEMOGLOBIN A1C
Est. average glucose Bld gHb Est-mCnc: 143 mg/dL
Hgb A1c MFr Bld: 6.6 % — ABNORMAL HIGH (ref 4.8–5.6)

## 2019-07-02 IMAGING — CR DG CHEST 2V
1 series · 2 of 2 positions shown · non-contrast
Comparison: None.

CLINICAL DATA: Left-sided chest pain for 2 days

EXAM:
CHEST - 2 VIEW

[Series 1: dg chest 2 view · 0.14mm/px · 2 of 2 slices shown]
[im 1/2]
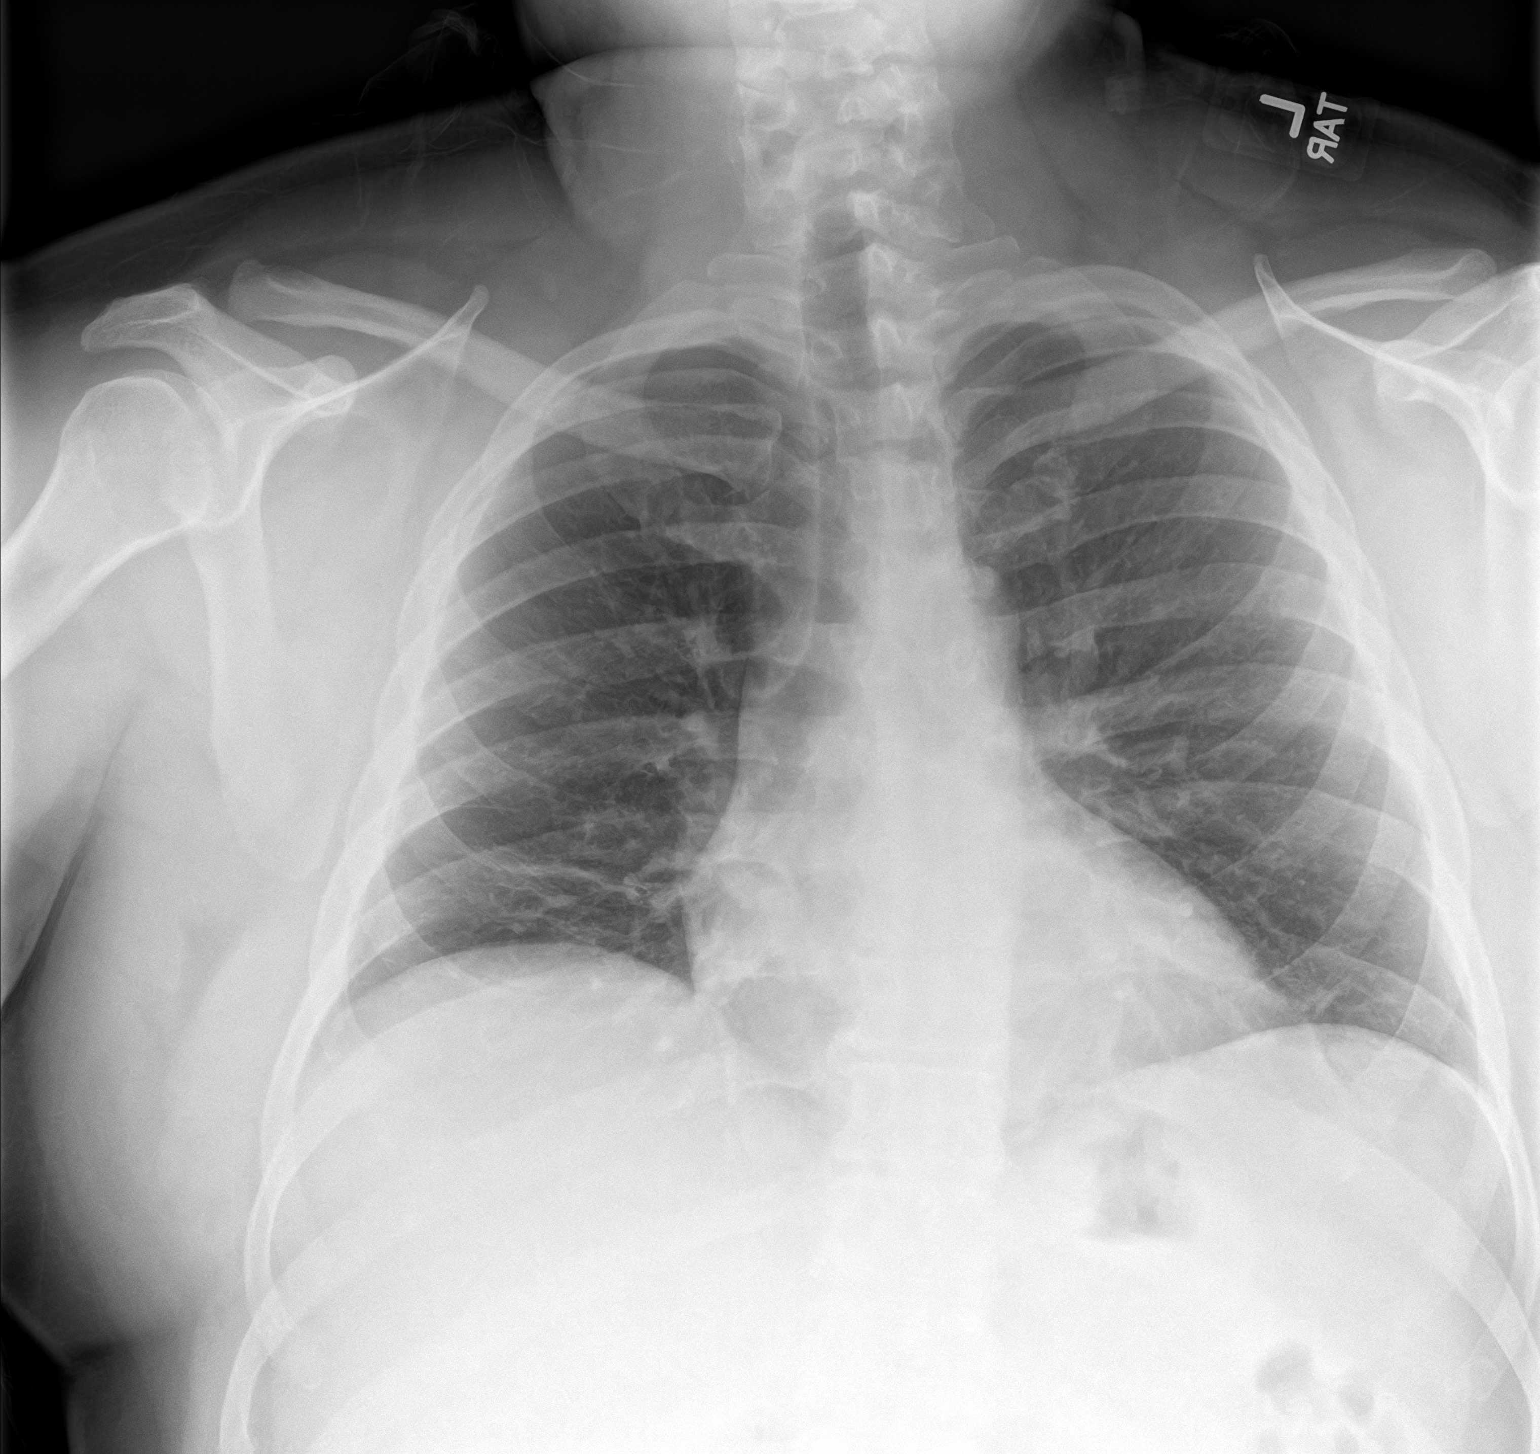
[im 2/2]
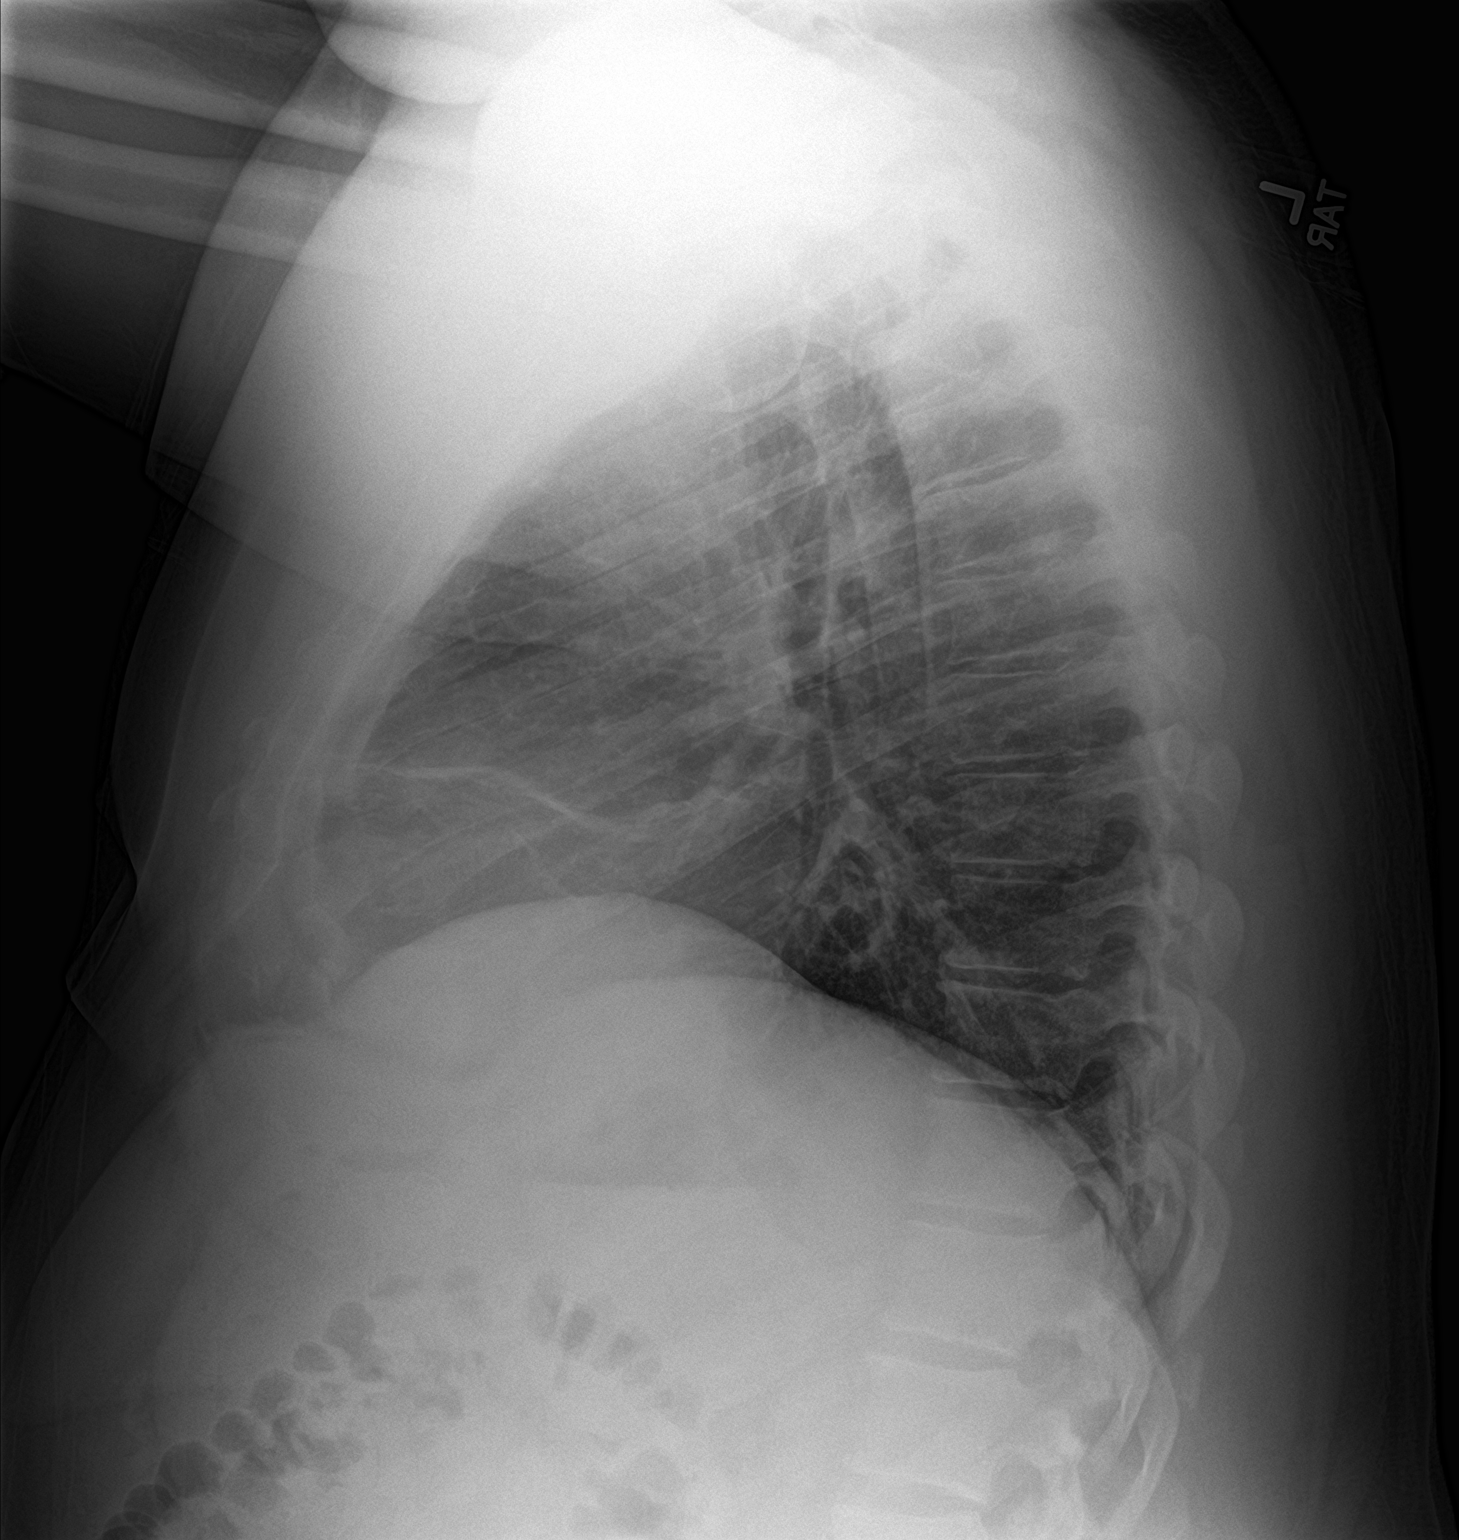

[2 of 2 positions shown; findings below may reference images not displayed]

FINDINGS: Cardiac shadow is within normal limits. The lungs are well aerated
bilaterally. Mild atelectatic changes are noted in the right middle
lobe. No sizable effusion is seen.
IMPRESSION: Right middle lobe atelectasis

## 2019-10-07 ENCOUNTER — Ambulatory Visit: Payer: Self-pay | Admitting: Internal Medicine

## 2019-10-09 ENCOUNTER — Ambulatory Visit: Payer: Self-pay | Admitting: Internal Medicine

## 2019-11-03 ENCOUNTER — Ambulatory Visit: Payer: Self-pay | Admitting: Internal Medicine

## 2019-11-11 ENCOUNTER — Other Ambulatory Visit: Payer: Self-pay

## 2019-11-11 ENCOUNTER — Encounter: Payer: Self-pay | Admitting: Internal Medicine

## 2019-11-11 ENCOUNTER — Ambulatory Visit: Payer: Managed Care, Other (non HMO) | Admitting: Internal Medicine

## 2019-11-11 VITALS — BP 120/86 | HR 85 | Temp 98.6°F | Ht 65.0 in | Wt 268.0 lb

## 2019-11-11 DIAGNOSIS — R7303 Prediabetes: Secondary | ICD-10-CM | POA: Diagnosis not present

## 2019-11-11 DIAGNOSIS — M940 Chondrocostal junction syndrome [Tietze]: Secondary | ICD-10-CM

## 2019-11-11 NOTE — Patient Instructions (Signed)
Costochondritis  Costochondritis is swelling and irritation (inflammation) of the tissue (cartilage) that connects your ribs to your breastbone (sternum). This causes pain in the front of your chest. The pain usually starts gradually and involves more than one rib. What are the causes? The exact cause of this condition is not always known. It results from stress on the cartilage where your ribs attach to your sternum. The cause of this stress could be:  Chest injury (trauma).  Exercise or activity, such as lifting.  Severe coughing. What increases the risk? You may be at higher risk for this condition if you:  Are male.  Are 30?46 years old.  Recently started a new exercise or work activity.  Have low levels of vitamin D.  Have a condition that makes you cough frequently. What are the signs or symptoms? The main symptom of this condition is chest pain. The pain:  Usually starts gradually and can be sharp or dull.  Gets worse with deep breathing, coughing, or exercise.  Gets better with rest.  May be worse when you press on the sternum-rib connection (tenderness). How is this diagnosed? This condition is diagnosed based on your symptoms, medical history, and a physical exam. Your health care provider will check for tenderness when pressing on your sternum. This is the most important finding. You may also have tests to rule out other causes of chest pain. These may include:  A chest X-ray to check for lung problems.  An electrocardiogram (ECG) to see if you have a heart problem that could be causing the pain.  An imaging scan to rule out a chest or rib fracture. How is this treated? This condition usually goes away on its own over time. Your health care provider may prescribe an NSAID to reduce pain and inflammation. Your health care provider may also suggest that you:  Rest and avoid activities that make pain worse.  Apply heat or cold to the area to reduce pain and  inflammation.  Do exercises to stretch your chest muscles. If these treatments do not help, your health care provider may inject a numbing medicine at the sternum-rib connection to help relieve the pain. Follow these instructions at home:  Avoid activities that make pain worse. This includes any activities that use chest, abdominal, and side muscles.  If directed, put ice on the painful area: ? Put ice in a plastic bag. ? Place a towel between your skin and the bag. ? Leave the ice on for 20 minutes, 2-3 times a day.  If directed, apply heat to the affected area as often as told by your health care provider. Use the heat source that your health care provider recommends, such as a moist heat pack or a heating pad. ? Place a towel between your skin and the heat source. ? Leave the heat on for 20-30 minutes. ? Remove the heat if your skin turns bright red. This is especially important if you are unable to feel pain, heat, or cold. You may have a greater risk of getting burned.  Take over-the-counter and prescription medicines only as told by your health care provider.  Return to your normal activities as told by your health care provider. Ask your health care provider what activities are safe for you.  Keep all follow-up visits as told by your health care provider. This is important. Contact a health care provider if:  You have chills or a fever.  Your pain does not go away or it gets   worse.  You have a cough that does not go away (is persistent). Get help right away if:  You have shortness of breath. This information is not intended to replace advice given to you by your health care provider. Make sure you discuss any questions you have with your health care provider. Document Revised: 05/01/2017 Document Reviewed: 08/10/2015 Elsevier Patient Education  2020 Elsevier Inc.  

## 2019-11-11 NOTE — Progress Notes (Signed)
Date:  11/11/2019   Name:  Caleb Duncan   DOB:  1975/11/27   MRN:  [id number]   Chief Complaint: Diabetes (follow up ) and Chest Pain (deep under muscle, cough it hurts, deep breath would feel pain, moving arm painful, today is better )  Diabetes He presents for his follow-up diabetic visit. 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 His disease course has been stable. Pertinent negatives for hypoglycemia include no headaches or tremors. Associated symptoms include chest pain. Pertinent negatives for diabetes include no fatigue, no polydipsia and no polyuria.  Chest Pain  This is a new problem. The current episode started 1 to 4 weeks ago. The onset quality is sudden. The problem has been gradually improving. The pain is present in the lateral region. The pain is mild. The pain does not radiate. Pertinent negatives include no abdominal pain, cough, headaches, numbness, palpitations or shortness of breath. The pain is aggravated by lifting arm. He has tried nothing for the symptoms.    Lab Results  Component Value Date   CREATININE 1.03 03/24/2019   BUN 17 03/24/2019   NA 135 03/24/2019   K 3.7 03/24/2019   CL 103 03/24/2019   CO2 25 03/24/2019   Lab Results  Component Value Date   CHOL 239 (H) 12/27/2017   HDL 40 12/27/2017   LDLCALC 146 (H) 12/27/2017   TRIG 267 (H) 12/27/2017   CHOLHDL 6.0 (H) 12/27/2017   No results found for: TSH Lab Results  Component Value Date   HGBA1C 6.6 (H) 06/30/2019   Lab Results  Component Value Date   WBC 8.6 03/24/2019   HGB 14.6 03/24/2019   HCT 42.3 03/24/2019   MCV 90.0 03/24/2019   PLT 308 03/24/2019   Lab Results  Component Value Date   ALT 34 03/24/2019   AST 22 03/24/2019   ALKPHOS 60 03/24/2019   BILITOT 0.4 03/24/2019     Review of Systems  Constitutional: Negative for appetite change, fatigue and unexpected weight change (lost 10 lbs with effort).  Eyes: Negative for visual disturbance.    Respiratory: Negative for cough, shortness of breath and wheezing.   Cardiovascular: Positive for chest pain. Negative for palpitations and leg swelling.  Gastrointestinal: Negative for abdominal pain and blood in stool.  Endocrine: Negative for polydipsia and polyuria.  Genitourinary: Negative for dysuria and hematuria.  Skin: Negative for color change and rash.  Neurological: Negative for tremors, numbness and headaches.  Psychiatric/Behavioral: Negative for dysphoric mood.    Patient Active Problem List   Diagnosis Date Noted  . Mixed hyperlipidemia 12/28/2017  . Gastroesophageal reflux disease 08/23/2017  . Urinary frequency 08/23/2017  . Erectile dysfunction 08/23/2017  . Sciatica 08/23/2017    No Known Allergies  Past Surgical History:  Procedure Laterality Date  . ANKLE SURGERY Right 1999  . TONSILLECTOMY      Social History   Tobacco Use  . Smoking status: Never Smoker  . Smokeless tobacco: Never Used  Vaping Use  . Vaping Use: Some days  Substance Use Topics  . Alcohol use: Yes    Alcohol/week: 6.0 standard drinks    Types: 4 Cans of beer, 2 Shots of liquor per week  . Drug use: Yes    Frequency: 1.0 times per week    Types: Marijuana     Medication list has been reviewed and updated.  Current Meds  Medication Sig  . sildenafil (REVATIO) 20 MG tablet TALE 1-2 TABLETS BY MOUTH  DAILY AS NEEDED    PHQ 2/9 Scores 11/11/2019 06/30/2019 03/24/2019 12/27/2017  PHQ - 2 Score 0 0 0 0  PHQ- 9 Score 0 - - 1    GAD 7 : Generalized Anxiety Score 11/11/2019  Nervous, Anxious, on Edge 0  Control/stop worrying 0  Worry too much - different things 0  Trouble relaxing 0  Restless 0  Easily annoyed or irritable 0  Afraid - awful might happen 0  Total GAD 7 Score 0  Anxiety Difficulty Not difficult at all    BP Readings from Last 3 Encounters:  11/11/19 120/86  06/30/19 116/84  03/24/19 140/84    Physical Exam Vitals and nursing note reviewed.   Constitutional:      General: He is not in acute distress.    Appearance: He is well-developed.  HENT:     Head: Normocephalic and atraumatic.  Cardiovascular:     Rate and Rhythm: Normal rate and regular rhythm.  No extrasystoles are present.    Heart sounds: Normal heart sounds.  Pulmonary:     Effort: Pulmonary effort is normal. No respiratory distress.     Breath sounds: Normal breath sounds.  Chest:    Musculoskeletal:        General: Normal range of motion.     Right lower leg: No edema.     Left lower leg: No edema.  Skin:    General: Skin is warm and dry.     Capillary Refill: Capillary refill takes less than 2 seconds.     Findings: No rash.  Neurological:     General: No focal deficit present.     Mental Status: He is alert and oriented to person, place, and time.  Psychiatric:        Behavior: Behavior normal.        Thought Content: Thought content normal.     Wt Readings from Last 3 Encounters:  11/11/19 268 lb (121.6 kg)  06/30/19 278 lb (126.1 kg)  03/24/19 278 lb (126.1 kg)    BP 120/86   Pulse 85   Temp 98.6 F (37 C) (Oral)   Ht 5\' 5"  (1.651 m)   Wt 268 lb (121.6 kg)   SpO2 96%   BMI 44.60 kg/m   Assessment and Plan: 1. Prediabetes He has changed his diet - eliminating sugar and has lost 10 lbs. He feels well.  His joint pain has completely resolved. Continue diet and weight loss efforts - Hemoglobin A1c  2. Costochondritis Pt is reassured; can use heat or tylenol as needed Avoid heavy lifting and pulling for the short term   Partially dictated using . Any errors are unintentional.  Animal nutritionist, MD [facility]  11/11/2019

## 2019-11-12 LAB — HEMOGLOBIN A1C
Est. average glucose Bld gHb Est-mCnc: 131 mg/dL
Hgb A1c MFr Bld: 6.2 % — ABNORMAL HIGH (ref 4.8–5.6)

## 2020-03-19 ENCOUNTER — Other Ambulatory Visit: Payer: Self-pay | Admitting: Internal Medicine

## 2020-03-19 DIAGNOSIS — N5201 Erectile dysfunction due to arterial insufficiency: Secondary | ICD-10-CM

## 2020-06-29 ENCOUNTER — Telehealth: Payer: Self-pay | Admitting: Internal Medicine

## 2020-06-29 NOTE — Telephone Encounter (Signed)
Called pt left VM to call back.  Will route result note to [facility] Nurse Triage for follow up when patient returns call to clinic. Nurse may give results to patient if they return call. CRM created for this message.   KP

## 2020-06-29 NOTE — Telephone Encounter (Signed)
Pts wife called stating that the pt went to refill his sildenafil and was told that his insurance does not cover this medication anymore. She states  That they are looking for another off brand that isn't as expensive. Please advise.      [facility] [address], [place] [facility] [address]  [address] [place] [address]  Phone: [phone] Fax: [phone]  Hours: Not open 24 hours

## 2020-06-30 NOTE — Telephone Encounter (Signed)
Spoke to pt he is going to call back.  Pt was prescribed the most cost effective medication. Pt would have to pay out of pocket or try to get the medication sent in from urology. Urology is a specialist if they send in the medication pt has a higher chance of insurance approving medication.  Will route result note to [facility] Nurse Triage for follow up when patient returns call to clinic. Nurse may give results to patient if they return call. CRM created for this message.   KP

## 2020-06-30 NOTE — Telephone Encounter (Signed)
Spoke to patient's wife- she states the pharmacy did not give them an alternative choice about what was covered in the place of this medication. She is going to call the insurance company to see what the alternatives are and will call the office back to let Dr Otho Darner know.

## 2020-08-13 IMAGING — CR DG HAND COMPLETE 3+V*L*
3 series · 3 of 3 positions shown · non-contrast
Comparison: None.

CLINICAL DATA: Pain and swelling of the third and fourth digits.

EXAM:
LEFT HAND - COMPLETE 3+ VIEW

[hand ap]
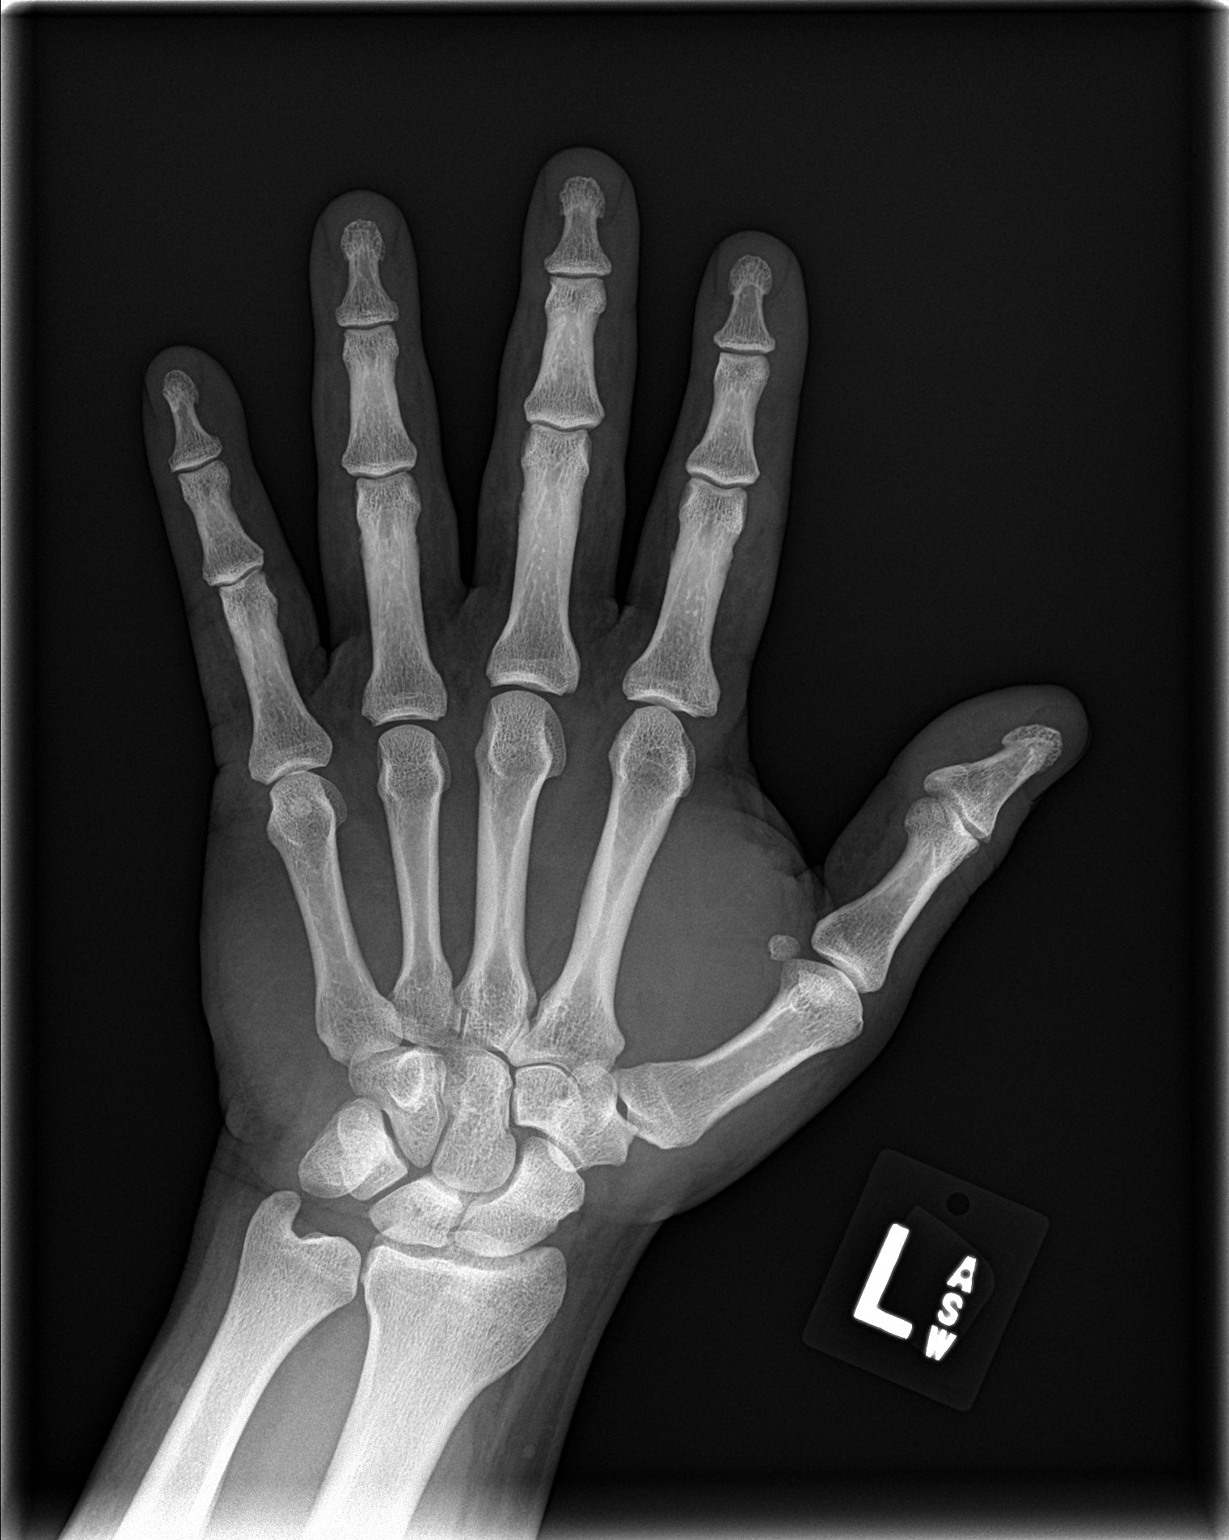

[hand obl]
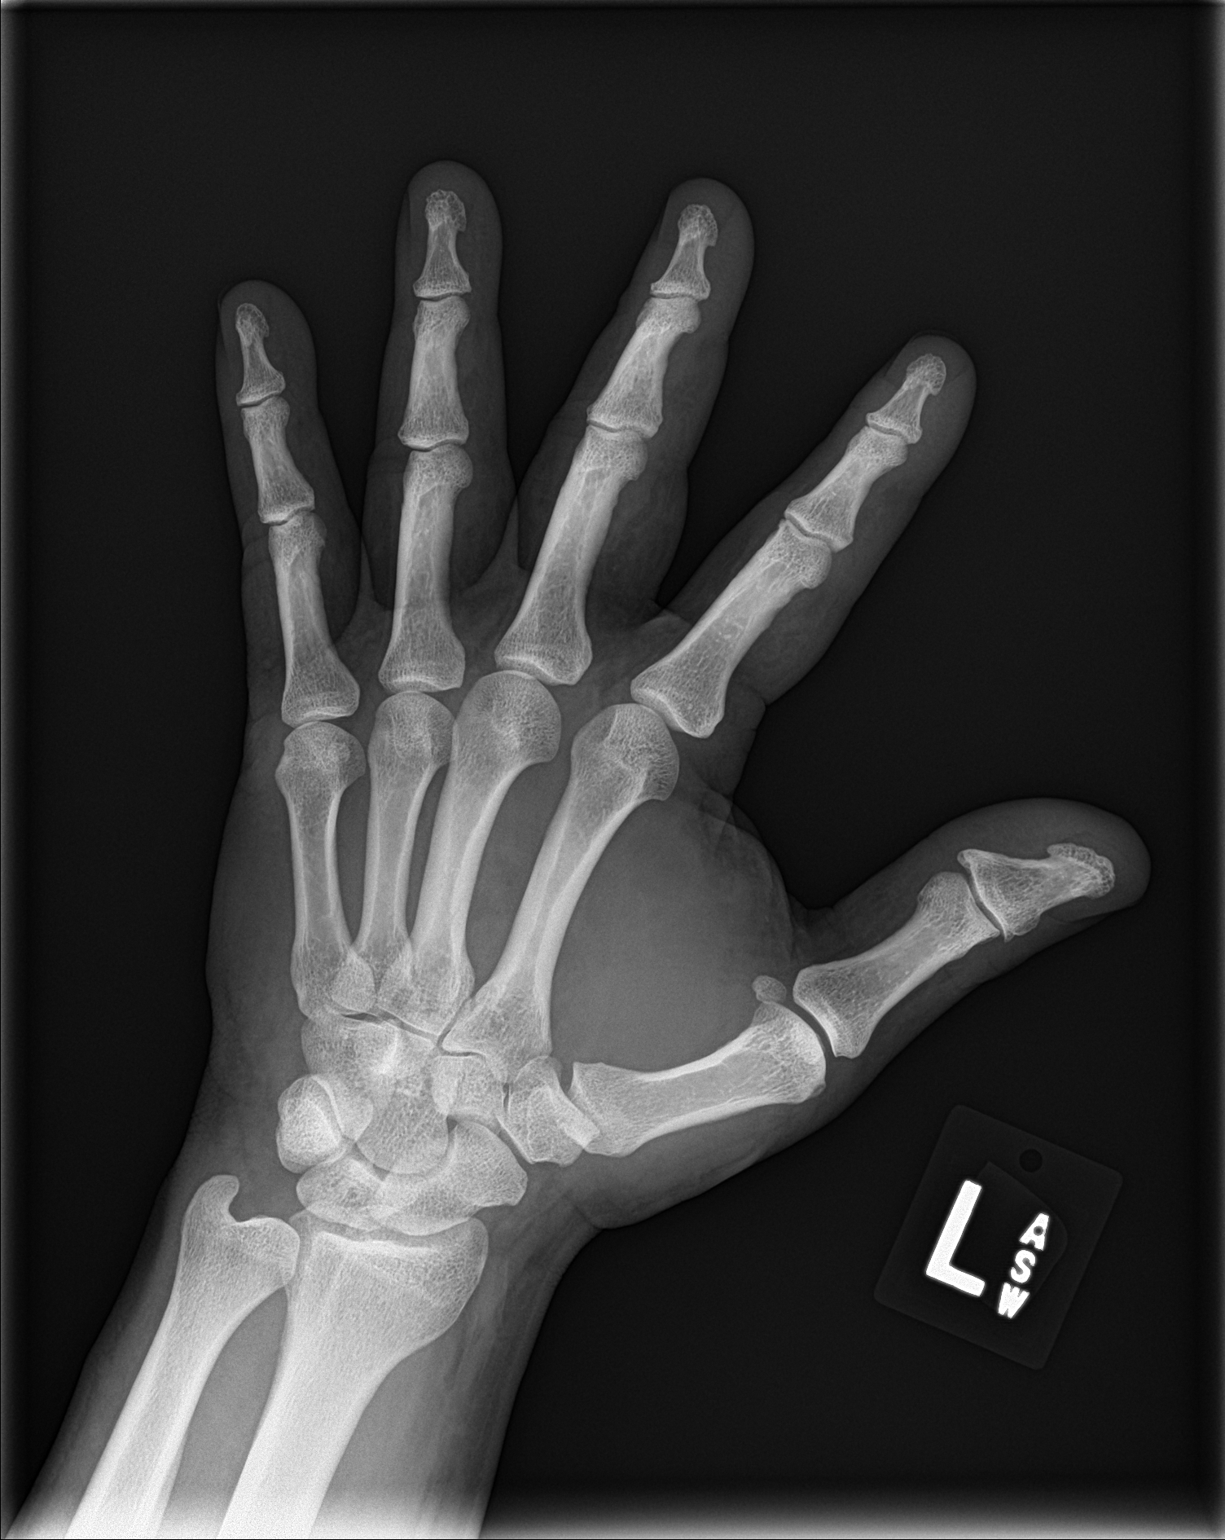

[hand lat]
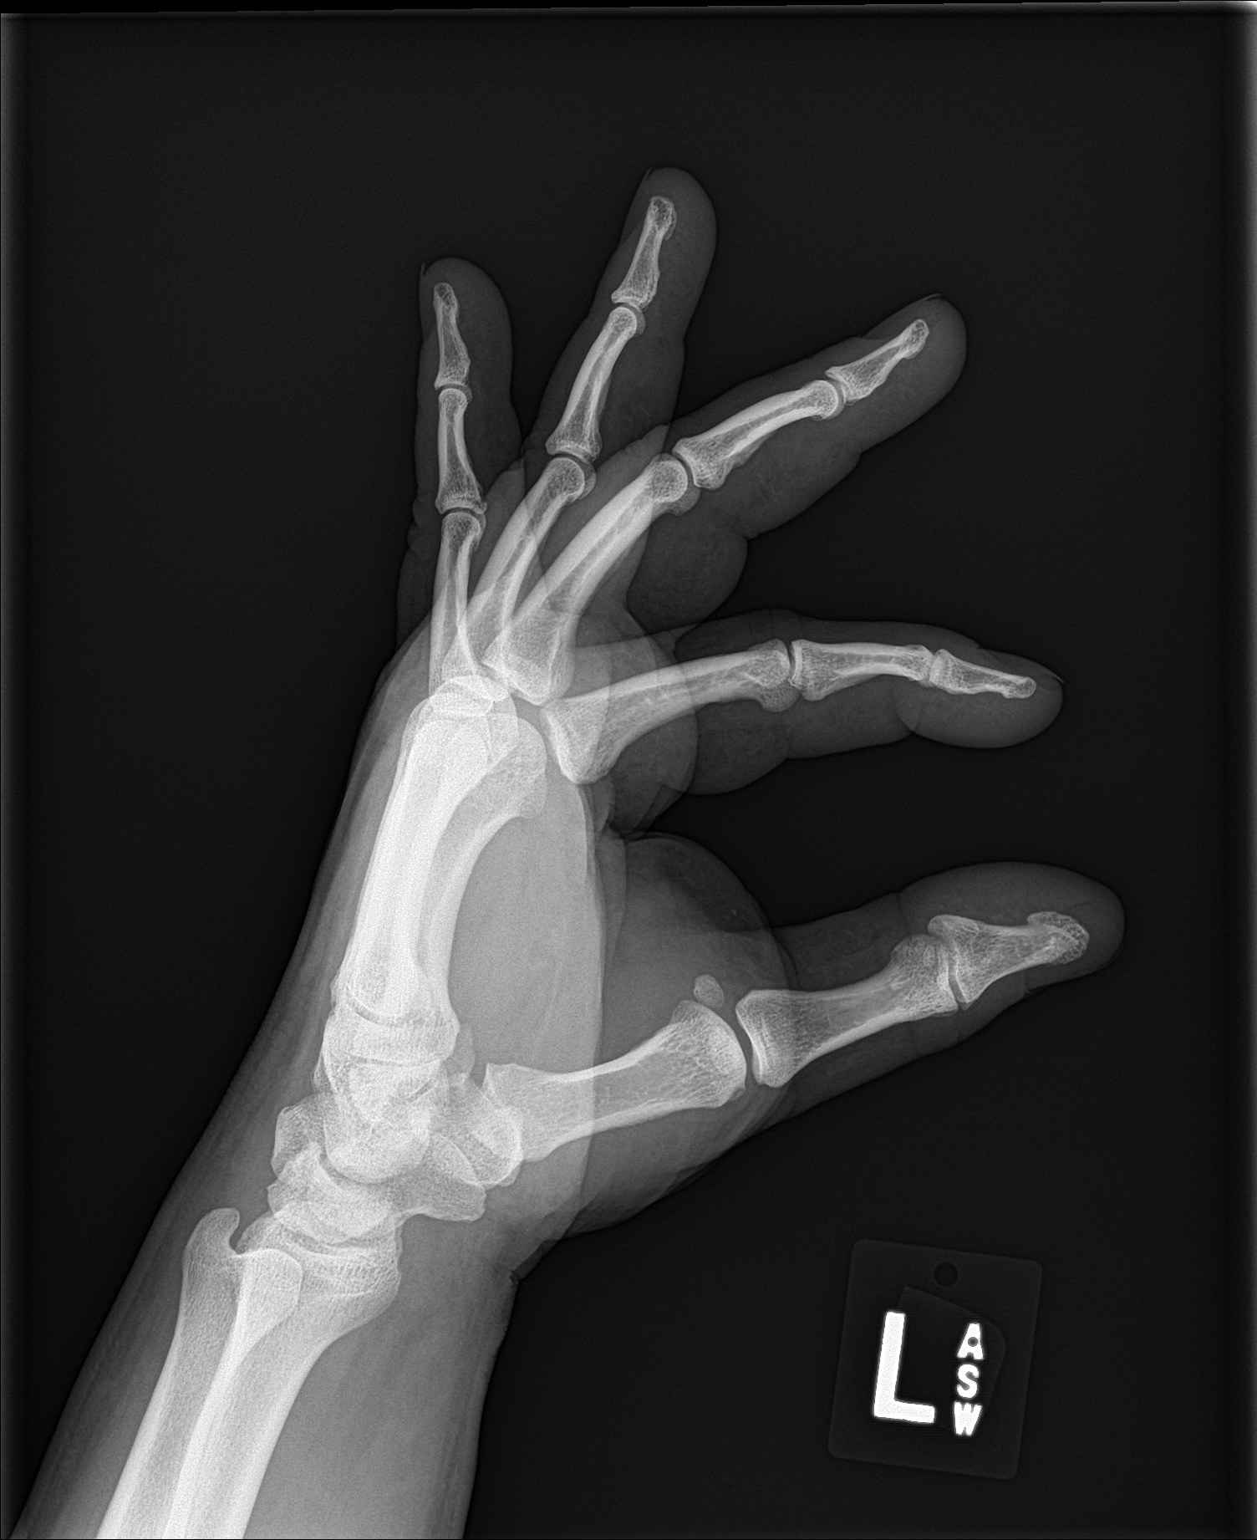

[3 of 3 positions shown; findings below may reference images not displayed]

FINDINGS: There is no acute displaced fracture or dislocation. There is
nonspecific soft tissue swelling of the third and fourth digits.
There is no radiopaque foreign body. There appears to be generalized
soft tissue swelling about the hand, most notably involving the web
space between the first and second digits.
IMPRESSION: 1. No acute bony abnormality.
2. Nonspecific soft tissue swelling of the hand as detailed above.

## 2020-08-16 ENCOUNTER — Telehealth: Payer: Self-pay | Admitting: Internal Medicine

## 2020-08-16 NOTE — Telephone Encounter (Signed)
Pts wife called stating that the pts prescription for sildenafil is not covered by insurance. She is requesting to know if there is another medication that can be sent in that is covered by insurance. Please advise.   '     [facility] [address], [place] - [address]  [address] [place] [address]  Phone: [phone] Fax: [phone]  Hours: Not open 24 hours

## 2020-08-16 NOTE — Telephone Encounter (Signed)
Spoke to pt let him know that he would have to pay out of pocket for medication. Told pt to call the pharmacy to see how much the medication is. Also offered a referral to urology for pt to see if he could get the medication approved. Pt refused urology referral and verbalized understanding.  KP

## 2020-11-01 ENCOUNTER — Telehealth: Payer: Self-pay | Admitting: Internal Medicine

## 2020-11-01 NOTE — Telephone Encounter (Signed)
Pt needs an appt for DM.  KP

## 2020-11-01 NOTE — Telephone Encounter (Signed)
Medication: sildenafil (REVATIO) 20 MG tablet [[id number]]   Has the patient contacted their pharmacy? YES  (Agent: If no, request that the patient contact the pharmacy for the refill.) (Agent: If yes, when and what did the pharmacy advise?)  Preferred Pharmacy (with phone number or street name): [facility] [address] [place] - [address] [place] [address] Phone: [phone] Fax: [phone] Hours: Not open 24 hours    Agent: Please be advised that RX refills may take up to 3 business days. We ask that you follow-up with your pharmacy.

## 2020-11-02 ENCOUNTER — Ambulatory Visit: Payer: Managed Care, Other (non HMO) | Admitting: Internal Medicine

## 2020-11-02 ENCOUNTER — Other Ambulatory Visit: Payer: Self-pay

## 2020-11-02 ENCOUNTER — Encounter: Payer: Self-pay | Admitting: Internal Medicine

## 2020-11-02 VITALS — BP 112/82 | HR 74 | Temp 98.4°F | Ht 65.0 in | Wt 257.0 lb

## 2020-11-02 DIAGNOSIS — N529 Male erectile dysfunction, unspecified: Secondary | ICD-10-CM

## 2020-11-02 DIAGNOSIS — R7303 Prediabetes: Secondary | ICD-10-CM

## 2020-11-02 DIAGNOSIS — Z125 Encounter for screening for malignant neoplasm of prostate: Secondary | ICD-10-CM | POA: Diagnosis not present

## 2020-11-02 DIAGNOSIS — Z1159 Encounter for screening for other viral diseases: Secondary | ICD-10-CM | POA: Diagnosis not present

## 2020-11-02 MED ORDER — SILDENAFIL CITRATE 100 MG PO TABS: 100.0000 mg | ORAL_TABLET | Freq: Every day | ORAL | 5 refills | Status: DC | PRN

## 2020-11-02 NOTE — Progress Notes (Addendum)
Date:  11/02/2020   Name:  Caleb Duncan   DOB:  03-21-1976   MRN:  [id number]   Chief Complaint: Diabetes and Erectile Dysfunction  Diabetes He presents for his follow-up diabetic visit. Diabetes type: prediabetes. The initial diagnosis of diabetes was made 2 years ago. Pertinent negatives for hypoglycemia include no dizziness, headaches or nervousness/anxiousness. Pertinent negatives for diabetes include no chest pain, no fatigue, no foot paresthesias, no foot ulcerations, no visual change and no weight loss. Current diabetic treatment includes diet. His weight is decreasing steadily. He is following a generally healthy diet.  Erectile Dysfunction This is a chronic problem. The current episode started more than 1 year ago. The problem is unchanged. The nature of his difficulty is maintaining erection. He reports no anxiety. Irritative symptoms do not include nocturia. Obstructive symptoms do not include an intermittent stream or a slower stream. Pertinent negatives include no chills. Nothing aggravates the symptoms. Past treatments include sildenafil. The treatment provided significant relief.   Lab Results  Component Value Date   CREATININE 1.03 03/24/2019   BUN 17 03/24/2019   NA 135 03/24/2019   K 3.7 03/24/2019   CL 103 03/24/2019   CO2 25 03/24/2019   Lab Results  Component Value Date   CHOL 239 (H) 12/27/2017   HDL 40 12/27/2017   LDLCALC 146 (H) 12/27/2017   TRIG 267 (H) 12/27/2017   CHOLHDL 6.0 (H) 12/27/2017   No results found for: TSH Lab Results  Component Value Date   HGBA1C 6.2 (H) 11/11/2019   Lab Results  Component Value Date   WBC 8.6 03/24/2019   HGB 14.6 03/24/2019   HCT 42.3 03/24/2019   MCV 90.0 03/24/2019   PLT 308 03/24/2019   Lab Results  Component Value Date   ALT 34 03/24/2019   AST 22 03/24/2019   ALKPHOS 60 03/24/2019   BILITOT 0.4 03/24/2019     Review of Systems  Constitutional:  Negative for chills, fatigue, fever, unexpected  weight change and weight loss.  Respiratory:  Negative for chest tightness and shortness of breath.   Cardiovascular:  Positive for leg swelling (intermittent). Negative for chest pain.  Gastrointestinal:  Negative for abdominal pain.  Genitourinary:  Negative for nocturia.       ED  Neurological:  Negative for dizziness, light-headedness and headaches.  Psychiatric/Behavioral:  Negative for dysphoric mood and sleep disturbance. The patient is not nervous/anxious.    Patient Active Problem List   Diagnosis Date Noted   Prediabetes 11/11/2019   Mixed hyperlipidemia 12/28/2017   Gastroesophageal reflux disease 08/23/2017   Urinary frequency 08/23/2017   Erectile dysfunction 08/23/2017   Sciatica 08/23/2017    No Known Allergies  Past Surgical History:  Procedure Laterality Date   ANKLE SURGERY Right 1999   TONSILLECTOMY      Social History   Tobacco Use   Smoking status: Never   Smokeless tobacco: Never  Vaping Use   Vaping Use: Some days  Substance Use Topics   Alcohol use: Yes    Alcohol/week: 6.0 standard drinks    Types: 4 Cans of beer, 2 Shots of liquor per week   Drug use: Yes    Frequency: 1.0 times per week    Types: Marijuana     Medication list has been reviewed and updated.  Current Meds  Medication Sig   sildenafil (REVATIO) 20 MG tablet TALE 1-2 TABLETS BY MOUTH DAILY AS NEEDED    PHQ 2/9 Scores 11/02/2020 11/11/2019 06/30/2019 03/24/2019  PHQ - 2 Score 0 0 0 0  PHQ- 9 Score 0 0 - -    GAD 7 : Generalized Anxiety Score 11/02/2020 11/11/2019  Nervous, Anxious, on Edge 0 0  Control/stop worrying 0 0  Worry too much - different things 0 0  Trouble relaxing 0 0  Restless 0 0  Easily annoyed or irritable 0 0  Afraid - awful might happen 0 0  Total GAD 7 Score 0 0  Anxiety Difficulty Not difficult at all Not difficult at all    BP Readings from Last 3 Encounters:  11/02/20 112/82  11/11/19 120/86  06/30/19 116/84    Physical Exam Vitals and  nursing note reviewed.  Constitutional:      General: He is not in acute distress.    Appearance: Normal appearance. He is well-developed.  HENT:     Head: Normocephalic and atraumatic.  Neck:     Vascular: No carotid bruit.  Cardiovascular:     Rate and Rhythm: Normal rate and regular rhythm.     Pulses: Normal pulses.     Heart sounds: No murmur heard. Pulmonary:     Effort: Pulmonary effort is normal. No respiratory distress.     Breath sounds: No wheezing or rhonchi.  Musculoskeletal:     Cervical back: Normal range of motion.     Right lower leg: No edema.     Left lower leg: No edema.  Lymphadenopathy:     Cervical: No cervical adenopathy.  Skin:    General: Skin is warm and dry.     Findings: No rash.  Neurological:     General: No focal deficit present.     Mental Status: He is alert and oriented to person, place, and time.  Psychiatric:        Mood and Affect: Mood normal.        Behavior: Behavior normal.    Wt Readings from Last 3 Encounters:  11/02/20 257 lb (116.6 kg)  11/11/19 268 lb (121.6 kg)  06/30/19 278 lb (126.1 kg)    BP 112/82 (BP Location: Right Arm, Patient Position: Sitting, Cuff Size: Large)   Pulse 74   Temp 98.4 F (36.9 C) (Oral)   Ht 5\' 5"  (1.651 m)   Wt 257 lb (116.6 kg)   SpO2 97%   BMI 42.77 kg/m   Assessment and Plan: 1. Prediabetes Continue diet, exercise and weight loss Will advise on medication if needed - Comprehensive metabolic panel - Hemoglobin A1c  2. Erectile dysfunction, unspecified erectile dysfunction type He has been taking sildenafil 20 mg but the cost was high - he would like to try viagra and cut the tablets in fourths - sildenafil (VIAGRA) 100 MG tablet; Take 1 tablet (100 mg total) by mouth daily as needed for erectile dysfunction.  Dispense: 6 tablet; Refill: 5  3. Prostate cancer screening DRE deferred He has several family members with prostate cancer - PSA  4. Need for hepatitis C screening test -  Hepatitis C antibody  He will also check with his insurance on coverage of screening colonoscopy at age 54  Partially dictated using Dragon software. Any errors are unintentional.  52, MD [facility]  11/02/2020

## 2020-11-03 LAB — COMPREHENSIVE METABOLIC PANEL
ALT: 24 IU/L (ref 0–44)
AST: 19 IU/L (ref 0–40)
Albumin/Globulin Ratio: 1.7 (ref 1.2–2.2)
Albumin: 4.6 g/dL (ref 4.0–5.0)
Alkaline Phosphatase: 68 IU/L (ref 44–121)
BUN/Creatinine Ratio: 18 (ref 9–20)
BUN: 19 mg/dL (ref 6–24)
Bilirubin Total: 0.3 mg/dL (ref 0.0–1.2)
CO2: 18 mmol/L — ABNORMAL LOW (ref 20–29)
Calcium: 9.5 mg/dL (ref 8.7–10.2)
Chloride: 103 mmol/L (ref 96–106)
Creatinine, Ser: 1.05 mg/dL (ref 0.76–1.27)
Globulin, Total: 2.7 g/dL (ref 1.5–4.5)
Glucose: 86 mg/dL (ref 65–99)
Potassium: 4.5 mmol/L (ref 3.5–5.2)
Sodium: 139 mmol/L (ref 134–144)
Total Protein: 7.3 g/dL (ref 6.0–8.5)
eGFR: 88 mL/min/{1.73_m2} (ref >59)

## 2020-11-03 LAB — PSA: Prostate Specific Ag, Serum: 0.5 ng/mL (ref 0.0–4.0)

## 2020-11-03 LAB — HEMOGLOBIN A1C
Est. average glucose Bld gHb Est-mCnc: 128 mg/dL
Hgb A1c MFr Bld: 6.1 % — ABNORMAL HIGH (ref 4.8–5.6)

## 2020-11-03 LAB — HEPATITIS C ANTIBODY: Hep C Virus Ab: 0.2 s/co ratio (ref 0.0–0.9)

## 2020-11-07 ENCOUNTER — Other Ambulatory Visit: Payer: Self-pay | Admitting: Internal Medicine

## 2020-11-07 ENCOUNTER — Telehealth: Payer: Self-pay | Admitting: Internal Medicine

## 2020-11-07 DIAGNOSIS — N529 Male erectile dysfunction, unspecified: Secondary | ICD-10-CM

## 2020-11-07 MED ORDER — VIAGRA 100 MG PO TABS: 100.0000 mg | ORAL_TABLET | Freq: Every day | ORAL | 5 refills | Status: DC | PRN

## 2020-11-07 NOTE — Telephone Encounter (Signed)
Pt calling stating that the pt prescription, sildenafil 100MG , is not being covered by his insurance. He states that the pharmacy has advised him that he would need to have the name brand Viagra 100 MG sent in for his insurance to cover it. Please advise.      [facility] [address] - [address]  [address] [place]  Phone: [phone] Fax: [phone]  Hours: Not open 24 hours

## 2020-11-09 ENCOUNTER — Other Ambulatory Visit: Payer: Self-pay

## 2020-11-09 DIAGNOSIS — N529 Male erectile dysfunction, unspecified: Secondary | ICD-10-CM

## 2020-11-09 DIAGNOSIS — N5201 Erectile dysfunction due to arterial insufficiency: Secondary | ICD-10-CM

## 2020-11-09 MED ORDER — SILDENAFIL CITRATE 20 MG PO TABS: 20.0000 mg | ORAL_TABLET | ORAL | 5 refills | Status: DC | PRN

## 2020-11-09 NOTE — Telephone Encounter (Signed)
Pt called and asked if he can get a new Rx for 30 day supply instead of the 6 tablets / pt states it will also need a PA / [facility]/ please advise / pt is asking for this today so he can pick up correct amount /dose of medication

## 2020-11-09 NOTE — Telephone Encounter (Signed)
Spoke to pt let him know that we do not do PAs for ED meds. Insurance does not approved 20 MG for ED. A 30 days supply is not covered unless diagnosed with pulmonary HTN which the pt does not have. Pt will call back to let me know which meds he wants to pay for out of pocket whether it be 20 MG for 30 days or 6 tablets at 100 MG. Pt verbalized understanding and will call back with which dosage he wants sent in.  KP

## 2020-11-10 ENCOUNTER — Telehealth: Payer: Self-pay

## 2020-11-10 NOTE — Telephone Encounter (Signed)
Copied from CRM [id number]. Topic: General - Other >> Nov 10, 2020 11:49 AM Jaquita Rector A wrote: Reason for CRM: Selena Batten with [facility] called in to inform Dr Judithann Graves that they are not able to fill this Rx sildenafil (REVATIO) 20 MG tablet  due to Rx not accepting prior authorization Ph# [phone]  ext# [phone]. Say that patient have been notified and informed to check with his local pharmacy

## 2020-11-10 NOTE — Telephone Encounter (Signed)
Noted  KP 

## 2020-11-16 ENCOUNTER — Other Ambulatory Visit: Payer: Self-pay

## 2020-11-16 DIAGNOSIS — N5201 Erectile dysfunction due to arterial insufficiency: Secondary | ICD-10-CM

## 2020-11-16 MED ORDER — SILDENAFIL CITRATE 20 MG PO TABS: 20.0000 mg | ORAL_TABLET | ORAL | 1 refills | Status: DC | PRN

## 2020-11-16 NOTE — Telephone Encounter (Signed)
Pt states he will pay out of pocket for the   sildenafil (REVATIO) 20 MG tablet Quantity 60 1-2 tabs as needed daily.  [id number] [place] [place] - [address]

## 2020-11-16 NOTE — Telephone Encounter (Signed)
Refill sent to pharmacy. ? ?KP ?

## 2021-06-27 ENCOUNTER — Ambulatory Visit: Payer: Self-pay | Admitting: *Deleted

## 2021-06-27 NOTE — Telephone Encounter (Signed)
°  Chief Complaint: left leg swelling , made appt  Symptoms: left leg from foot/ ankle to knee esp calf per wife swollen. Redness noted to calf. Discomfort when walking. Denies pain.  Frequency: x 1 week  Pertinent Negatives: Patient denies chest pain, difficulty breathing Disposition: [x] ED /[] Urgent Care (no appt availability in office) / [] Appointment(In office/virtual)/ []  [facility]/ [] Home Care/ [] Refused Recommended Disposition /[] [facility]/ []  Follow-up with PCP Additional Notes:   Instructed patient to go to ED due to sx. Patient already has appt tomorrow. Patient;s wife reports she will try to get patient to go to ED/ call 911 if worsening sx noted     Reason for Disposition  [1] Thigh, calf, or ankle swelling AND [2] only 1 side  Answer Assessment - Initial Assessment Questions 1. ONSET: "When did the swelling start?" (e.g., minutes, hours, days)     Approx 1 week ago or longer  2. LOCATION: "What part of the leg is swollen?"  "Are both legs swollen or just one leg?"     Left leg from foot/ ankle to knee esp calf 3. SEVERITY: "How bad is the swelling?" (e.g., localized; mild, moderate, severe)  - Localized - small area of swelling localized to one leg  - MILD pedal edema - swelling limited to foot and ankle, pitting edema < 1/4 inch (6 mm) deep, rest and elevation eliminate most or all swelling  - MODERATE edema - swelling of lower leg to knee, pitting edema > 1/4 inch (6 mm) deep, rest and elevation only partially reduce swelling  - SEVERE edema - swelling extends above knee, facial or hand swelling present      Redness to left calf 4. REDNESS: "Does the swelling look red or infected?"     Red  5. PAIN: "Is the swelling painful to touch?" If Yes, ask: "How painful is it?"   (Scale 1-10; mild, moderate or severe)     Denies  6. FEVER: "Do you have a fever?" If Yes, ask: "What is it, how was it measured, and when did it start?"      na 7. CAUSE: "What  do you think is causing the leg swelling?"     Not sure  8. MEDICAL HISTORY: "Do you have a history of heart failure, kidney disease, liver failure, or cancer?"     na 9. RECURRENT SYMPTOM: "Have you had leg swelling before?" If Yes, ask: "When was the last time?" "What happened that time?"     Yes , it went away  10. OTHER SYMPTOMS: "Do you have any other symptoms?" (e.g., chest pain, difficulty breathing)       No  11. PREGNANCY: "Is there any chance you are pregnant?" "When was your last menstrual period?"       na  Protocols used: Leg Swelling and Edema-A-AH

## 2021-06-28 ENCOUNTER — Other Ambulatory Visit: Payer: Self-pay

## 2021-06-28 ENCOUNTER — Ambulatory Visit: Payer: BC Managed Care – PPO | Admitting: Internal Medicine

## 2021-06-28 ENCOUNTER — Encounter: Payer: Self-pay | Admitting: Internal Medicine

## 2021-06-28 VITALS — BP 120/82 | HR 68 | Ht 65.0 in | Wt 273.0 lb

## 2021-06-28 DIAGNOSIS — E782 Mixed hyperlipidemia: Secondary | ICD-10-CM | POA: Diagnosis not present

## 2021-06-28 DIAGNOSIS — R600 Localized edema: Secondary | ICD-10-CM | POA: Insufficient documentation

## 2021-06-28 DIAGNOSIS — R7303 Prediabetes: Secondary | ICD-10-CM

## 2021-06-28 NOTE — Progress Notes (Signed)
? ? ?Date:  06/28/2021  ? ?Name:  Caleb Duncan   DOB:  06-Jul-1975   MRN:  [id number] ? ? ?Chief Complaint: Edema (Left Foot, Ankle, and Calf swollen. Not painful. Red toned. ) ? ?HPI ?Edema -  intermittent edema in the left leg for some time.  Worse over the past week.  It improves overnight.  He is drinking water but does not limit salt intake.  No injury.  He works as long Fish farm manager.  No redness or tenderness noted.   ? ?Lab Results  ?Component Value Date  ? NA 139 11/02/2020  ? K 4.5 11/02/2020  ? CO2 18 (L) 11/02/2020  ? GLUCOSE 86 11/02/2020  ? BUN 19 11/02/2020  ? CREATININE 1.05 11/02/2020  ? CALCIUM 9.5 11/02/2020  ? EGFR 88 11/02/2020  ? GFRNONAA >60 03/24/2019  ? ?Lab Results  ?Component Value Date  ? CHOL 239 (H) 12/27/2017  ? HDL 40 12/27/2017  ? LDLCALC 146 (H) 12/27/2017  ? TRIG 267 (H) 12/27/2017  ? CHOLHDL 6.0 (H) 12/27/2017  ? ?No results found for: TSH ?Lab Results  ?Component Value Date  ? HGBA1C 6.1 (H) 11/02/2020  ? ?Lab Results  ?Component Value Date  ? WBC 8.6 03/24/2019  ? HGB 14.6 03/24/2019  ? HCT 42.3 03/24/2019  ? MCV 90.0 03/24/2019  ? PLT 308 03/24/2019  ? ?Lab Results  ?Component Value Date  ? ALT 24 11/02/2020  ? AST 19 11/02/2020  ? ALKPHOS 68 11/02/2020  ? BILITOT 0.3 11/02/2020  ? ?No results found for: 25OHVITD2, [place], VD25OH  ? ?Review of Systems  ?Constitutional:  Positive for unexpected weight change (has gain 20 lbs since last visit). Negative for chills, fatigue and fever.  ?Respiratory:  Positive for shortness of breath (one brief episode while walking last week). Negative for chest tightness and wheezing.   ?Cardiovascular:  Positive for leg swelling. Negative for chest pain and palpitations.  ?Musculoskeletal:  Negative for arthralgias and joint swelling.  ?Neurological:  Negative for dizziness, light-headedness and headaches.  ?Psychiatric/Behavioral:  Negative for dysphoric mood and sleep disturbance. The patient is not nervous/anxious.   ? ?Patient  Active Problem List  ? Diagnosis Date Noted  ? Prediabetes 11/11/2019  ? Mixed hyperlipidemia 12/28/2017  ? Gastroesophageal reflux disease 08/23/2017  ? Urinary frequency 08/23/2017  ? Erectile dysfunction 08/23/2017  ? Sciatica 08/23/2017  ? ? ?No Known Allergies ? ?Past Surgical History:  ?Procedure Laterality Date  ? ANKLE SURGERY Right 1999  ? TONSILLECTOMY    ? ? ?Social History  ? ?Tobacco Use  ? Smoking status: Never  ? Smokeless tobacco: Never  ?Vaping Use  ? Vaping Use: Some days  ?Substance Use Topics  ? Alcohol use: Yes  ?  Alcohol/week: 6.0 standard drinks  ?  Types: 4 Cans of beer, 2 Shots of liquor per week  ? Drug use: Yes  ?  Frequency: 1.0 times per week  ?  Types: Marijuana  ? ? ? ?Medication list has been reviewed and updated. ? ?Current Meds  ?Medication Sig  ? sildenafil (REVATIO) 20 MG tablet Take 1 tablet (20 mg total) by mouth as needed. 1-2 tablets daily as needed  ? ? ?PHQ 2/9 Scores 06/28/2021 11/02/2020 11/11/2019 06/30/2019  ?PHQ - 2 Score 0 0 0 0  ?PHQ- 9 Score 2 0 0 -  ? ? ?GAD 7 : Generalized Anxiety Score 06/28/2021 11/02/2020 11/11/2019  ?Nervous, Anxious, on Edge 0 0 0  ?Control/stop worrying 0 0 0  ?  Worry too much - different things 0 0 0  ?Trouble relaxing 0 0 0  ?Restless 0 0 0  ?Easily annoyed or irritable 0 0 0  ?Afraid - awful might happen 0 0 0  ?Total GAD 7 Score 0 0 0  ?Anxiety Difficulty Not difficult at all Not difficult at all Not difficult at all  ? ? ?BP Readings from Last 3 Encounters:  ?06/28/21 120/82  ?11/02/20 112/82  ?11/11/19 120/86  ? ? ?Physical Exam ?Vitals and nursing note reviewed.  ?Constitutional:   ?   General: He is not in acute distress. ?   Appearance: He is well-developed. He is obese.  ?HENT:  ?   Head: Normocephalic and atraumatic.  ?Neck:  ?   Vascular: No carotid bruit.  ?Cardiovascular:  ?   Rate and Rhythm: Normal rate and regular rhythm.  ?   Pulses: Normal pulses.  ?   Heart sounds: No murmur heard. ?Pulmonary:  ?   Effort: Pulmonary effort is normal.  No respiratory distress.  ?   Breath sounds: No wheezing or rhonchi.  ?Musculoskeletal:  ?   Cervical back: Normal range of motion.  ?   Right lower leg: No tenderness or bony tenderness. 2+ Edema present.  ?   Left lower leg: No tenderness or bony tenderness. 2+ Edema present.  ?   Comments: Negative for calf tenderness bilaterally; neg Homan's sign bilaterally.  No redness or increased warmth. ?Pulses 2+ DP bilaterally ?The dorsum of the left foot is slightly swollen compared to the right.  ?Lymphadenopathy:  ?   Cervical: No cervical adenopathy.  ?Skin: ?   General: Skin is warm and dry.  ?   Capillary Refill: Capillary refill takes less than 2 seconds.  ?   Findings: No rash.  ?Neurological:  ?   Mental Status: He is alert and oriented to person, place, and time.  ?Psychiatric:     ?   Mood and Affect: Mood normal.     ?   Behavior: Behavior normal.  ? ? ?Wt Readings from Last 3 Encounters:  ?06/28/21 273 lb (123.8 kg)  ?11/02/20 257 lb (116.6 kg)  ?11/11/19 268 lb (121.6 kg)  ? ? ?BP 120/82   Pulse 68   Ht _0  (1.651 m)   Wt 273 lb (123.8 kg)   SpO2 97%   BMI 45.43 kg/m?  ? ?Assessment and Plan: ?1. Bilateral lower extremity edema ?The left is slightly worse than the right but no evidence of DVT ?Likely a combination of venous insufficieny, excess dietary sodium, sedentary job and weight gain. ?Recommend light-moderate compression knee high compression socks during the day. ?Limit sodium, increase water intake and elevate when able. ?Will refer to VS at wife's request. ?- Ambulatory referral to Vascular Surgery ?- CBC with Differential/Platelet ?- Comprehensive metabolic panel ?- TSH ? ?2. Prediabetes ?Check labs for worsening since weight gain. ?- Hemoglobin A1c ? ?3. Mixed hyperlipidemia ?Not currently on medication - limit dietary fats. ? ? ?Partially dictated using Editor, commissioning. Any errors are unintentional. ? ?Halina Maidens, MD ?[facility] ?[place]  [facility] ? ?06/28/2021 ? ? ? ? ?

## 2021-06-28 NOTE — Patient Instructions (Signed)
Wear light-moderate weight compression knee high compression socks during the day.   Do not sleep in them. ?

## 2021-06-29 LAB — CBC WITH DIFFERENTIAL/PLATELET
Basophils Absolute: 0 10*3/uL (ref 0.0–0.2)
Basos: 0 %
EOS (ABSOLUTE): 0.1 10*3/uL (ref 0.0–0.4)
Eos: 1 %
Hematocrit: 45.1 % (ref 37.5–51.0)
Hemoglobin: 15.1 g/dL (ref 13.0–17.7)
Immature Grans (Abs): 0 10*3/uL (ref 0.0–0.1)
Immature Granulocytes: 0 %
Lymphocytes Absolute: 2.4 10*3/uL (ref 0.7–3.1)
Lymphs: 35 %
MCH: 30 pg (ref 26.6–33.0)
MCHC: 33.5 g/dL (ref 31.5–35.7)
MCV: 90 fL (ref 79–97)
Monocytes Absolute: 0.8 10*3/uL (ref 0.1–0.9)
Monocytes: 12 %
Neutrophils Absolute: 3.6 10*3/uL (ref 1.4–7.0)
Neutrophils: 52 %
Platelets: 302 10*3/uL (ref 150–450)
RBC: 5.04 x10E6/uL (ref 4.14–5.80)
RDW: 12.9 % (ref 11.6–15.4)
WBC: 6.9 10*3/uL (ref 3.4–10.8)

## 2021-06-29 LAB — COMPREHENSIVE METABOLIC PANEL
ALT: 29 IU/L (ref 0–44)
AST: 19 IU/L (ref 0–40)
Albumin/Globulin Ratio: 1.4 (ref 1.2–2.2)
Albumin: 4.3 g/dL (ref 4.0–5.0)
Alkaline Phosphatase: 77 IU/L (ref 44–121)
BUN/Creatinine Ratio: 13 (ref 9–20)
BUN: 13 mg/dL (ref 6–24)
Bilirubin Total: <0.2 mg/dL (ref 0.0–1.2)
CO2: 21 mmol/L (ref 20–29)
Calcium: 9.5 mg/dL (ref 8.7–10.2)
Chloride: 105 mmol/L (ref 96–106)
Creatinine, Ser: 0.98 mg/dL (ref 0.76–1.27)
Globulin, Total: 3 g/dL (ref 1.5–4.5)
Glucose: 91 mg/dL (ref 70–99)
Potassium: 5 mmol/L (ref 3.5–5.2)
Sodium: 140 mmol/L (ref 134–144)
Total Protein: 7.3 g/dL (ref 6.0–8.5)
eGFR: 96 mL/min/{1.73_m2} (ref >59)

## 2021-06-29 LAB — TSH: TSH: 2.02 u[IU]/mL (ref 0.450–4.500)

## 2021-06-29 LAB — HEMOGLOBIN A1C
Est. average glucose Bld gHb Est-mCnc: 134 mg/dL
Hgb A1c MFr Bld: 6.3 % — ABNORMAL HIGH (ref 4.8–5.6)

## 2021-07-03 ENCOUNTER — Encounter: Payer: Self-pay | Admitting: Internal Medicine

## 2021-07-03 NOTE — Telephone Encounter (Signed)
Please review.  KP

## 2021-08-02 ENCOUNTER — Other Ambulatory Visit (INDEPENDENT_AMBULATORY_CARE_PROVIDER_SITE_OTHER): Payer: Self-pay | Admitting: Nurse Practitioner

## 2021-08-02 DIAGNOSIS — R600 Localized edema: Secondary | ICD-10-CM

## 2021-08-03 ENCOUNTER — Ambulatory Visit (INDEPENDENT_AMBULATORY_CARE_PROVIDER_SITE_OTHER): Payer: BC Managed Care – PPO | Admitting: Nurse Practitioner

## 2021-08-03 ENCOUNTER — Encounter (INDEPENDENT_AMBULATORY_CARE_PROVIDER_SITE_OTHER): Payer: Self-pay | Admitting: Nurse Practitioner

## 2021-08-03 ENCOUNTER — Ambulatory Visit (INDEPENDENT_AMBULATORY_CARE_PROVIDER_SITE_OTHER): Payer: BC Managed Care – PPO

## 2021-08-03 VITALS — BP 138/96 | HR 76 | Resp 16 | Ht 65.5 in | Wt 272.6 lb

## 2021-08-03 DIAGNOSIS — R600 Localized edema: Secondary | ICD-10-CM

## 2021-08-03 DIAGNOSIS — E782 Mixed hyperlipidemia: Secondary | ICD-10-CM | POA: Diagnosis not present

## 2021-08-13 ENCOUNTER — Encounter (INDEPENDENT_AMBULATORY_CARE_PROVIDER_SITE_OTHER): Payer: Self-pay | Admitting: Nurse Practitioner

## 2021-08-13 NOTE — Progress Notes (Signed)
? ?Subjective:  ? ? Patient ID: Caleb Duncan, male    DOB: 1975-12-14, 46 y.o.   MRN: [id number] ?Chief Complaint  ?Patient presents with  ? New Patient (Initial Visit)  ?  Ref   ? ? ?Dorn Bhardwaj 46 year old male who presents today for evaluation for lower extremity edema.  This has been ongoing for the last 1.5 months.  He notes that he is gained approximately 15 pounds in that timeframe.  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  He notes that the worst his left shoulder for 2 weeks straight.  He has recently just started wearing compression and they are somewhat better. ? ?Today noninvasive study showed no evidence of DVT or superficial thrombophlebitis bilaterally.  No evidence of deep venous insufficiency or superficial venous reflux bilaterally. ? ? ?Review of Systems  ?Cardiovascular:  Positive for leg swelling.  ?All other systems reviewed and are negative. ? ?   ?Objective:  ? Physical Exam ?Vitals reviewed.  ?HENT:  ?   Head: Normocephalic.  ?Cardiovascular:  ?   Rate and Rhythm: Normal rate.  ?Pulmonary:  ?   Effort: Pulmonary effort is normal.  ?Musculoskeletal:  ?   Right lower leg: Edema present.  ?   Left lower leg: Edema present.  ?Skin: ?   General: Skin is warm and dry.  ?Neurological:  ?   Mental Status: He is alert and oriented to person, place, and time.  ?Psychiatric:     ?   Mood and Affect: Mood normal.     ?   Behavior: Behavior normal.     ?   Thought Content: Thought content normal.     ?   Judgment: Judgment normal.  ? ? ?BP (!) 138/96 (BP Location: Left Arm)   Pulse 76   Resp 16   Ht 5' 5.5" (1.664 m)   Wt 272 lb 9.6 oz (123.7 kg)   BMI 44.67 kg/m?  ? ?Past Medical History:  ?Diagnosis Date  ? Ankle injury 1999  ? right  ? Pre-diabetes   ? ? ?Social History  ? ?Socioeconomic History  ? Marital status: Married  ?  Spouse name: Not on file  ? Number of children: Not on file  ? Years of education: Not on file  ? Highest education level: Not on file   ?Occupational History  ?  Comment: truck driver  ?Tobacco Use  ? Smoking status: Never  ? Smokeless tobacco: Never  ?Vaping Use  ? Vaping Use: Some days  ?Substance and Sexual Activity  ? Alcohol use: Yes  ?  Alcohol/week: 6.0 standard drinks  ?  Types: 4 Cans of beer, 2 Shots of liquor per week  ? Drug use: Yes  ?  Frequency: 1.0 times per week  ?  Types: Marijuana  ? Sexual activity: Yes  ?  Partners: Female  ?Other Topics Concern  ? Not on file  ?Social History Narrative  ? Not on file  ? ?Social Determinants of Health  ? ?Financial Resource Strain: Not on file  ?Food Insecurity: Not on file  ?Transportation Needs: Not on file  ?Physical Activity: Not on file  ?Stress: Not on file  ?Social Connections: Not on file  ?Intimate Partner Violence: Not on file  ? ? ?Past Surgical History:  ?Procedure Laterality Date  ? ANKLE SURGERY Right 1999  ? TONSILLECTOMY    ? ? ?Family History  ?Problem Relation Age of Onset  ? Diabetes Mother   ?  Hypertension Father   ? Hypertension Brother   ? ? ?No Known Allergies ? ? ?  Latest Ref Rng & Units 06/28/2021  ? 10:57 AM 03/24/2019  ?  4:17 PM 02/09/2018  ?  9:58 AM  ?CBC  ?WBC 3.4 - 10.8 x10E3/uL 6.9   8.6   8.7    ?Hemoglobin 13.0 - 17.7 g/dL 35.3   61.4   43.1    ?Hematocrit 37.5 - 51.0 % 45.1   42.3   45.0    ?Platelets 150 - 450 x10E3/uL 302   308   279    ? ? ? ? ?CMP  ?   ?Component Value Date/Time  ? NA 140 06/28/2021 1057  ? K 5.0 06/28/2021 1057  ? CL 105 06/28/2021 1057  ? CO2 21 06/28/2021 1057  ? GLUCOSE 91 06/28/2021 1057  ? GLUCOSE 117 (H) 03/24/2019 1617  ? BUN 13 06/28/2021 1057  ? CREATININE 0.98 06/28/2021 1057  ? CALCIUM 9.5 06/28/2021 1057  ? PROT 7.3 06/28/2021 1057  ? ALBUMIN 4.3 06/28/2021 1057  ? AST 19 06/28/2021 1057  ? ALT 29 06/28/2021 1057  ? ALKPHOS 77 06/28/2021 1057  ? BILITOT <0.2 06/28/2021 1057  ? GFRNONAA >60 03/24/2019 1617  ? GFRAA >60 03/24/2019 1617  ? ? ? ?No results found. ? ?   ?Assessment & Plan:  ? ?1. Bilateral lower extremity  edema ?Recommend: ? ?I have had a long discussion with the patient regarding swelling and why it  causes symptoms.  Patient will begin wearing graduated compression on a daily basis a prescription was given. The patient will  wear the stockings first thing in the morning and removing them in the evening. The patient is instructed specifically not to sleep in the stockings.  ? ?In addition, behavioral modification will be initiated.  This will include frequent elevation, use of over the counter pain medications and exercise such as walking. ? ?Consideration for a lymph pump will also be made based upon the effectiveness of conservative therapy.  This would help to improve the edema control and prevent sequela such as ulcers and infections  ? ?We will have the patient return in 3 months following conservative therapy to determine effectiveness. ? ?2. Mixed hyperlipidemia ?Continue statin as ordered and reviewed, no changes at this time  ? ? ?Current Outpatient Medications on File Prior to Visit  ?Medication Sig Dispense Refill  ? sildenafil (REVATIO) 20 MG tablet Take 1 tablet (20 mg total) by mouth as needed. 1-2 tablets daily as needed 60 tablet 1  ? ?No current facility-administered medications on file prior to visit.  ? ? ?There are no Patient Instructions on file for this visit. ?No follow-ups on file. ? ? ?Georgiana Spinner, NP ? ? ?

## 2021-10-21 ENCOUNTER — Other Ambulatory Visit: Payer: Self-pay | Admitting: Internal Medicine

## 2021-10-21 DIAGNOSIS — N5201 Erectile dysfunction due to arterial insufficiency: Secondary | ICD-10-CM

## 2021-10-25 ENCOUNTER — Ambulatory Visit (INDEPENDENT_AMBULATORY_CARE_PROVIDER_SITE_OTHER): Payer: BC Managed Care – PPO | Admitting: Internal Medicine

## 2021-10-25 NOTE — Progress Notes (Deleted)
  Date:  10/25/2021   Name:  Caleb Duncan   DOB:  06/08/1975   MRN:  [id number]   Chief Complaint: No chief complaint on file. Caleb Duncan is a 45 y.o. male who presents today for his Complete Annual Exam. He feels {DESC; WELL/FAIRLY WELL/POORLY:18703}. He reports exercising ***. He reports he is sleeping {DESC; WELL/FAIRLY WELL/POORLY:18703}.   Colonoscopy: none  Immunization History  Administered Date(s) Administered   Influenza,inj,Quad PF,6+ Mos 03/24/2019   Tdap 12/27/2017   Health Maintenance Due  Topic Date Due   HIV Screening  Never done   COLONOSCOPY (Pts 45-49yrs Insurance coverage will need to be confirmed)  Never done    Lab Results  Component Value Date   PSA1 0.5 11/02/2020   PSA1 0.4 12/27/2017    Diabetes He presents for his follow-up diabetic visit. Diabetes type: prediabetes. His disease course has been worsening. Pertinent negatives for hypoglycemia include no dizziness, headaches or nervousness/anxiousness. Pertinent negatives for diabetes include no chest pain and no fatigue.  Hyperlipidemia This is a chronic problem. The problem is uncontrolled. Pertinent negatives include no chest pain, myalgias or shortness of breath. He is currently on no antihyperlipidemic treatment.   Edema - seen by vascular surgery.  Compression stockings recommended. Also elevation, limiting sodium intake and nsaids for pain.  Lab Results  Component Value Date   NA 140 06/28/2021   K 5.0 06/28/2021   CO2 21 06/28/2021   GLUCOSE 91 06/28/2021   BUN 13 06/28/2021   CREATININE 0.98 06/28/2021   CALCIUM 9.5 06/28/2021   EGFR 96 06/28/2021   GFRNONAA >60 03/24/2019   Lab Results  Component Value Date   CHOL 239 (H) 12/27/2017   HDL 40 12/27/2017   LDLCALC 146 (H) 12/27/2017   TRIG 267 (H) 12/27/2017   CHOLHDL 6.0 (H) 12/27/2017   Lab Results  Component Value Date   TSH 2.020 06/28/2021   Lab Results  Component Value Date   HGBA1C 6.3 (H) 06/28/2021   Lab  Results  Component Value Date   WBC 6.9 06/28/2021   HGB 15.1 06/28/2021   HCT 45.1 06/28/2021   MCV 90 06/28/2021   PLT 302 06/28/2021   Lab Results  Component Value Date   ALT 29 06/28/2021   AST 19 06/28/2021   ALKPHOS 77 06/28/2021   BILITOT <0.2 06/28/2021   No results found for: "25OHVITD2", "25OHVITD3", "VD25OH"   Review of Systems  Constitutional:  Negative for appetite change, chills, diaphoresis, fatigue and unexpected weight change.  HENT:  Negative for hearing loss, tinnitus, trouble swallowing and voice change.   Eyes:  Negative for visual disturbance.  Respiratory:  Negative for choking, shortness of breath and wheezing.   Cardiovascular:  Positive for leg swelling. Negative for chest pain and palpitations.  Gastrointestinal:  Negative for abdominal pain, blood in stool, constipation and diarrhea.  Genitourinary:  Negative for difficulty urinating, dysuria and frequency.  Musculoskeletal:  Negative for arthralgias, back pain and myalgias.  Skin:  Negative for color change and rash.  Neurological:  Negative for dizziness, syncope and headaches.  Hematological:  Negative for adenopathy.  Psychiatric/Behavioral:  Negative for dysphoric mood and sleep disturbance. The patient is not nervous/anxious.     Patient Active Problem List   Diagnosis Date Noted   Bilateral lower extremity edema 06/28/2021   Prediabetes 11/11/2019   Mixed hyperlipidemia 12/28/2017   Gastroesophageal reflux disease 08/23/2017   Urinary frequency 08/23/2017   Erectile dysfunction 08/23/2017   Sciatica 08/23/2017      No Known Allergies  Past Surgical History:  Procedure Laterality Date   ANKLE SURGERY Right 1999   TONSILLECTOMY      Social History   Tobacco Use   Smoking status: Never   Smokeless tobacco: Never  Vaping Use   Vaping Use: Some days  Substance Use Topics   Alcohol use: Yes    Alcohol/week: 6.0 standard drinks of alcohol    Types: 4 Cans of beer, 2 Shots of  liquor per week   Drug use: Yes    Frequency: 1.0 times per week    Types: Marijuana     Medication list has been reviewed and updated.  No outpatient medications have been marked as taking for the 10/25/21 encounter (Appointment) with Berglund, Laura H, MD.       06/28/2021   10:22 AM 11/02/2020    4:03 PM 11/11/2019    2:20 PM  GAD 7 : Generalized Anxiety Score  Nervous, Anxious, on Edge 0 0 0  Control/stop worrying 0 0 0  Worry too much - different things 0 0 0  Trouble relaxing 0 0 0  Restless 0 0 0  Easily annoyed or irritable 0 0 0  Afraid - awful might happen 0 0 0  Total GAD 7 Score 0 0 0  Anxiety Difficulty Not difficult at all Not difficult at all Not difficult at all       06/28/2021   10:22 AM  Depression screen PHQ 2/9  Decreased Interest 0  Down, Depressed, Hopeless 0  PHQ - 2 Score 0  Altered sleeping 0  Tired, decreased energy 2  Change in appetite 0  Feeling bad or failure about yourself  0  Trouble concentrating 0  Moving slowly or fidgety/restless 0  Suicidal thoughts 0  PHQ-9 Score 2  Difficult doing work/chores Not difficult at all    BP Readings from Last 3 Encounters:  08/03/21 (!) 138/96  06/28/21 120/82  11/02/20 112/82    Physical Exam Vitals and nursing note reviewed.  Constitutional:      Appearance: Normal appearance. He is well-developed.  HENT:     Head: Normocephalic.     Right Ear: Tympanic membrane, ear canal and external ear normal.     Left Ear: Tympanic membrane, ear canal and external ear normal.     Nose: Nose normal.  Eyes:     Conjunctiva/sclera: Conjunctivae normal.     Pupils: Pupils are equal, round, and reactive to light.  Neck:     Thyroid: No thyromegaly.     Vascular: No carotid bruit.  Cardiovascular:     Rate and Rhythm: Normal rate and regular rhythm.     Heart sounds: Normal heart sounds.  Pulmonary:     Effort: Pulmonary effort is normal.     Breath sounds: Normal breath sounds. No wheezing.  Chest:   Breasts:    Right: No mass.     Left: No mass.  Abdominal:     General: Bowel sounds are normal.     Palpations: Abdomen is soft.     Tenderness: There is no abdominal tenderness.  Musculoskeletal:        General: Normal range of motion.     Cervical back: Normal range of motion and neck supple.  Lymphadenopathy:     Cervical: No cervical adenopathy.  Skin:    General: Skin is warm and dry.  Neurological:     Mental Status: He is alert and oriented to person, place, and time.       Deep Tendon Reflexes: Reflexes are normal and symmetric.  Psychiatric:        Attention and Perception: Attention normal.        Mood and Affect: Mood normal.        Thought Content: Thought content normal.     Wt Readings from Last 3 Encounters:  08/03/21 272 lb 9.6 oz (123.7 kg)  06/28/21 273 lb (123.8 kg)  11/02/20 257 lb (116.6 kg)    There were no vitals taken for this visit.  Assessment and Plan:

## 2021-10-25 NOTE — Progress Notes (Unsigned)
No Show

## 2021-11-09 ENCOUNTER — Ambulatory Visit (INDEPENDENT_AMBULATORY_CARE_PROVIDER_SITE_OTHER): Payer: BC Managed Care – PPO | Admitting: Nurse Practitioner

## 2022-02-26 ENCOUNTER — Encounter (INDEPENDENT_AMBULATORY_CARE_PROVIDER_SITE_OTHER): Payer: Self-pay

## 2022-07-02 ENCOUNTER — Other Ambulatory Visit: Payer: Self-pay | Admitting: Internal Medicine

## 2022-07-02 DIAGNOSIS — N5201 Erectile dysfunction due to arterial insufficiency: Secondary | ICD-10-CM

## 2022-07-03 NOTE — Telephone Encounter (Signed)
Spoke to pts wife [name] let her know a refill has been sent in. She verbalized understanding.  KP

## 2022-07-03 NOTE — Telephone Encounter (Signed)
Please schedule pt an appointment.  KP

## 2022-08-05 ENCOUNTER — Other Ambulatory Visit: Payer: Self-pay | Admitting: Internal Medicine

## 2022-08-05 DIAGNOSIS — N5201 Erectile dysfunction due to arterial insufficiency: Secondary | ICD-10-CM

## 2022-11-09 ENCOUNTER — Encounter: Payer: Self-pay | Admitting: Internal Medicine

## 2022-11-09 NOTE — Assessment & Plan Note (Deleted)
Glucoses managed with diet changes Lab Results  Component Value Date   HGBA1C 6.3 (H) 06/28/2021

## 2022-11-09 NOTE — Assessment & Plan Note (Deleted)
Cholesterol elevated - not on medication Lab Results  Component Value Date   LDLCALC 146 (H) 12/27/2017

## 2022-11-09 NOTE — Progress Notes (Deleted)
Date:  11/09/2022   Name:  Caleb Duncan   DOB:  01/05/1976   MRN:  [id number]   Chief Complaint: No chief complaint on file. Caleb Duncan is a 47 y.o. male who presents today for his Complete Annual Exam. He feels {DESC; WELL/FAIRLY WELL/POORLY:18703}. He reports exercising ***. He reports he is sleeping {DESC; WELL/FAIRLY WELL/POORLY:18703}.   Colonoscopy: none  Immunization History  Administered Date(s) Administered   Influenza,inj,Quad PF,6+ Mos 03/24/2019   Tdap 12/27/2017   Health Maintenance Due  Topic Date Due   HIV Screening  Never done   Colonoscopy  Never done    Lab Results  Component Value Date   PSA1 0.5 11/02/2020   PSA1 0.4 12/27/2017    Hyperlipidemia This is a chronic problem. Recent lipid tests were reviewed and are high. Pertinent negatives include no chest pain, myalgias or shortness of breath. Current antihyperlipidemic treatment includes diet change. The current treatment provides mild improvement of lipids.  Erectile Dysfunction This is a recurrent problem. The problem is unchanged. He reports no anxiety. Irritative symptoms do not include frequency. Pertinent negatives include no chills or dysuria. Past treatments include sildenafil. The treatment provided significant relief.    Lab Results  Component Value Date   NA 140 06/28/2021   K 5.0 06/28/2021   CO2 21 06/28/2021   GLUCOSE 91 06/28/2021   BUN 13 06/28/2021   CREATININE 0.98 06/28/2021   CALCIUM 9.5 06/28/2021   EGFR 96 06/28/2021   GFRNONAA >60 03/24/2019   Lab Results  Component Value Date   CHOL 239 (H) 12/27/2017   HDL 40 12/27/2017   LDLCALC 146 (H) 12/27/2017   TRIG 267 (H) 12/27/2017   CHOLHDL 6.0 (H) 12/27/2017   Lab Results  Component Value Date   TSH 2.020 06/28/2021   Lab Results  Component Value Date   HGBA1C 6.3 (H) 06/28/2021   Lab Results  Component Value Date   WBC 6.9 06/28/2021   HGB 15.1 06/28/2021   HCT 45.1 06/28/2021   MCV 90 06/28/2021   PLT  302 06/28/2021   Lab Results  Component Value Date   ALT 29 06/28/2021   AST 19 06/28/2021   ALKPHOS 77 06/28/2021   BILITOT <0.2 06/28/2021   No results found for: "25OHVITD2", "25OHVITD3", "VD25OH"   Review of Systems  Constitutional:  Negative for appetite change, chills, diaphoresis, fatigue and unexpected weight change.  HENT:  Negative for hearing loss, tinnitus, trouble swallowing and voice change.   Eyes:  Negative for visual disturbance.  Respiratory:  Negative for choking, shortness of breath and wheezing.   Cardiovascular:  Negative for chest pain, palpitations and leg swelling.  Gastrointestinal:  Negative for abdominal pain, blood in stool, constipation and diarrhea.  Genitourinary:  Negative for difficulty urinating, dysuria and frequency.  Musculoskeletal:  Negative for arthralgias, back pain and myalgias.  Skin:  Negative for color change and rash.  Neurological:  Negative for dizziness, syncope and headaches.  Hematological:  Negative for adenopathy.  Psychiatric/Behavioral:  Negative for dysphoric mood and sleep disturbance. The patient is not nervous/anxious.     Patient Active Problem List   Diagnosis Date Noted   Bilateral lower extremity edema 06/28/2021   Prediabetes 11/11/2019   Mixed hyperlipidemia 12/28/2017   Gastroesophageal reflux disease 08/23/2017   Urinary frequency 08/23/2017   Erectile dysfunction 08/23/2017   Sciatica 08/23/2017    No Known Allergies  Past Surgical History:  Procedure Laterality Date   ANKLE SURGERY Right 1999   TONSILLECTOMY  Social History   Tobacco Use   Smoking status: Never   Smokeless tobacco: Never  Vaping Use   Vaping status: Some Days  Substance Use Topics   Alcohol use: Yes    Alcohol/week: 6.0 standard drinks of alcohol    Types: 4 Cans of beer, 2 Shots of liquor per week   Drug use: Yes    Frequency: 1.0 times per week    Types: Marijuana     Medication list has been reviewed and  updated.  No outpatient medications have been marked as taking for the 11/09/22 encounter (Appointment) with Reubin Milan, MD.       06/28/2021   10:22 AM 11/02/2020    4:03 PM 11/11/2019    2:20 PM  GAD 7 : Generalized Anxiety Score  Nervous, Anxious, on Edge 0 0 0  Control/stop worrying 0 0 0  Worry too much - different things 0 0 0  Trouble relaxing 0 0 0  Restless 0 0 0  Easily annoyed or irritable 0 0 0  Afraid - awful might happen 0 0 0  Total GAD 7 Score 0 0 0  Anxiety Difficulty Not difficult at all Not difficult at all Not difficult at all       06/28/2021   10:22 AM 11/02/2020    4:03 PM 11/11/2019    2:19 PM  Depression screen PHQ 2/9  Decreased Interest 0 0 0  Down, Depressed, Hopeless 0 0 0  PHQ - 2 Score 0 0 0  Altered sleeping 0 0 0  Tired, decreased energy 2 0 0  Change in appetite 0 0 0  Feeling bad or failure about yourself  0 0 0  Trouble concentrating 0 0 0  Moving slowly or fidgety/restless 0 0 0  Suicidal thoughts 0 0 0  PHQ-9 Score 2 0 0  Difficult doing work/chores Not difficult at all Not difficult at all Not difficult at all    BP Readings from Last 3 Encounters:  08/03/21 (!) 138/96  06/28/21 120/82  11/02/20 112/82    Physical Exam Vitals and nursing note reviewed.  Constitutional:      Appearance: Normal appearance. He is well-developed.  HENT:     Head: Normocephalic.     Right Ear: Tympanic membrane, ear canal and external ear normal.     Left Ear: Tympanic membrane, ear canal and external ear normal.     Nose: Nose normal.  Eyes:     Conjunctiva/sclera: Conjunctivae normal.     Pupils: Pupils are equal, round, and reactive to light.  Neck:     Thyroid: No thyromegaly.     Vascular: No carotid bruit.  Cardiovascular:     Rate and Rhythm: Normal rate and regular rhythm.     Heart sounds: Normal heart sounds.  Pulmonary:     Effort: Pulmonary effort is normal.     Breath sounds: Normal breath sounds. No wheezing.  Chest:   Breasts:    Right: No mass.     Left: No mass.  Abdominal:     General: Bowel sounds are normal.     Palpations: Abdomen is soft.     Tenderness: There is no abdominal tenderness.  Musculoskeletal:        General: Normal range of motion.     Cervical back: Normal range of motion and neck supple.  Lymphadenopathy:     Cervical: No cervical adenopathy.  Skin:    General: Skin is warm and dry.  Neurological:  Mental Status: He is alert and oriented to person, place, and time.     Deep Tendon Reflexes: Reflexes are normal and symmetric.  Psychiatric:        Attention and Perception: Attention normal.        Mood and Affect: Mood normal.        Thought Content: Thought content normal.     Wt Readings from Last 3 Encounters:  08/03/21 272 lb 9.6 oz (123.7 kg)  06/28/21 273 lb (123.8 kg)  11/02/20 257 lb (116.6 kg)    There were no vitals taken for this visit.  Assessment and Plan:  Problem List Items Addressed This Visit     Prediabetes (Chronic)    Glucoses managed with diet changes Lab Results  Component Value Date   HGBA1C 6.3 (H) 06/28/2021         Mixed hyperlipidemia (Chronic)    Cholesterol elevated - not on medication Lab Results  Component Value Date   LDLCALC 146 (H) 12/27/2017         Other Visit Diagnoses     Annual physical exam    -  Primary   Colon cancer screening       Prostate cancer screening           No follow-ups on file.   Partially dictated using Dragon software, any errors are not intentional.  Reubin Milan, MD [facility] [place], [place]

## 2022-11-13 ENCOUNTER — Encounter: Payer: Self-pay | Admitting: Internal Medicine

## 2022-11-14 ENCOUNTER — Other Ambulatory Visit: Payer: Self-pay | Admitting: Internal Medicine

## 2022-11-14 DIAGNOSIS — N5201 Erectile dysfunction due to arterial insufficiency: Secondary | ICD-10-CM

## 2022-12-11 ENCOUNTER — Encounter: Payer: Self-pay | Admitting: Internal Medicine

## 2022-12-11 ENCOUNTER — Telehealth: Payer: Self-pay

## 2022-12-11 ENCOUNTER — Telehealth: Payer: Self-pay | Admitting: Internal Medicine

## 2022-12-11 NOTE — Telephone Encounter (Signed)
Patient missed his CPE appt today. Please call the patient to reschedule this.  - Caleb Duncan

## 2022-12-11 NOTE — Telephone Encounter (Signed)
Left voicemail to reschedule physical

## 2022-12-11 NOTE — Assessment & Plan Note (Deleted)
Glucoses managed with diet alone. Lab Results  Component Value Date   HGBA1C 6.3 (H) 06/28/2021

## 2022-12-11 NOTE — Assessment & Plan Note (Deleted)
Working with diet changes only. Lab Results  Component Value Date   LDLCALC 146 (H) 12/27/2017

## 2022-12-11 NOTE — Progress Notes (Deleted)
Date:  12/11/2022   Name:  Caleb Duncan   DOB:  07/07/1975   MRN:  [id number]   Chief Complaint: No chief complaint on file. Caleb Duncan is a 47 y.o. male who presents today for his Complete Annual Exam. He feels {DESC; WELL/FAIRLY WELL/POORLY:18703}. He reports exercising ***. He reports he is sleeping {DESC; WELL/FAIRLY WELL/POORLY:18703}.   Colonoscopy: none  Immunization History  Administered Date(s) Administered   Influenza,inj,Quad PF,6+ Mos 03/24/2019   Tdap 12/27/2017   Health Maintenance Due  Topic Date Due   HIV Screening  Never done   Colonoscopy  Never done   INFLUENZA VACCINE  11/29/2022    Lab Results  Component Value Date   PSA1 0.5 11/02/2020   PSA1 0.4 12/27/2017    HPI  Lab Results  Component Value Date   NA 140 06/28/2021   K 5.0 06/28/2021   CO2 21 06/28/2021   GLUCOSE 91 06/28/2021   BUN 13 06/28/2021   CREATININE 0.98 06/28/2021   CALCIUM 9.5 06/28/2021   EGFR 96 06/28/2021   GFRNONAA >60 03/24/2019   Lab Results  Component Value Date   CHOL 239 (H) 12/27/2017   HDL 40 12/27/2017   LDLCALC 146 (H) 12/27/2017   TRIG 267 (H) 12/27/2017   CHOLHDL 6.0 (H) 12/27/2017   Lab Results  Component Value Date   TSH 2.020 06/28/2021   Lab Results  Component Value Date   HGBA1C 6.3 (H) 06/28/2021   Lab Results  Component Value Date   WBC 6.9 06/28/2021   HGB 15.1 06/28/2021   HCT 45.1 06/28/2021   MCV 90 06/28/2021   PLT 302 06/28/2021   Lab Results  Component Value Date   ALT 29 06/28/2021   AST 19 06/28/2021   ALKPHOS 77 06/28/2021   BILITOT <0.2 06/28/2021   No results found for: "25OHVITD2", "25OHVITD3", "VD25OH"   Review of Systems  Constitutional:  Negative for appetite change, chills, diaphoresis, fatigue and unexpected weight change.  HENT:  Negative for hearing loss, tinnitus, trouble swallowing and voice change.   Eyes:  Negative for visual disturbance.  Respiratory:  Negative for choking, shortness of breath and  wheezing.   Cardiovascular:  Negative for chest pain, palpitations and leg swelling.  Gastrointestinal:  Negative for abdominal pain, blood in stool, constipation and diarrhea.  Genitourinary:  Negative for difficulty urinating, dysuria and frequency.  Musculoskeletal:  Negative for arthralgias, back pain and myalgias.  Skin:  Negative for color change and rash.  Neurological:  Negative for dizziness, syncope and headaches.  Hematological:  Negative for adenopathy.  Psychiatric/Behavioral:  Negative for dysphoric mood and sleep disturbance. The patient is not nervous/anxious.     Patient Active Problem List   Diagnosis Date Noted   Bilateral lower extremity edema 06/28/2021   Prediabetes 11/11/2019   Mixed hyperlipidemia 12/28/2017   Gastroesophageal reflux disease 08/23/2017   Urinary frequency 08/23/2017   Erectile dysfunction 08/23/2017   Sciatica 08/23/2017    No Known Allergies  Past Surgical History:  Procedure Laterality Date   ANKLE SURGERY Right 1999   TONSILLECTOMY      Social History   Tobacco Use   Smoking status: Never   Smokeless tobacco: Never  Vaping Use   Vaping status: Some Days  Substance Use Topics   Alcohol use: Yes    Alcohol/week: 6.0 standard drinks of alcohol    Types: 4 Cans of beer, 2 Shots of liquor per week   Drug use: Yes    Frequency: 1.0  times per week    Types: Marijuana     Medication list has been reviewed and updated.  No outpatient medications have been marked as taking for the 12/11/22 encounter (Appointment) with Reubin Milan, MD.       06/28/2021   10:22 AM 11/02/2020    4:03 PM 11/11/2019    2:20 PM  GAD 7 : Generalized Anxiety Score  Nervous, Anxious, on Edge 0 0 0  Control/stop worrying 0 0 0  Worry too much - different things 0 0 0  Trouble relaxing 0 0 0  Restless 0 0 0  Easily annoyed or irritable 0 0 0  Afraid - awful might happen 0 0 0  Total GAD 7 Score 0 0 0  Anxiety Difficulty Not difficult at all Not  difficult at all Not difficult at all       06/28/2021   10:22 AM 11/02/2020    4:03 PM 11/11/2019    2:19 PM  Depression screen PHQ 2/9  Decreased Interest 0 0 0  Down, Depressed, Hopeless 0 0 0  PHQ - 2 Score 0 0 0  Altered sleeping 0 0 0  Tired, decreased energy 2 0 0  Change in appetite 0 0 0  Feeling bad or failure about yourself  0 0 0  Trouble concentrating 0 0 0  Moving slowly or fidgety/restless 0 0 0  Suicidal thoughts 0 0 0  PHQ-9 Score 2 0 0  Difficult doing work/chores Not difficult at all Not difficult at all Not difficult at all    BP Readings from Last 3 Encounters:  08/03/21 (!) 138/96  06/28/21 120/82  11/02/20 112/82    Physical Exam Vitals and nursing note reviewed.  Constitutional:      Appearance: Normal appearance. He is well-developed.  HENT:     Head: Normocephalic.     Right Ear: Tympanic membrane, ear canal and external ear normal.     Left Ear: Tympanic membrane, ear canal and external ear normal.     Nose: Nose normal.  Eyes:     Conjunctiva/sclera: Conjunctivae normal.     Pupils: Pupils are equal, round, and reactive to light.  Neck:     Thyroid: No thyromegaly.     Vascular: No carotid bruit.  Cardiovascular:     Rate and Rhythm: Normal rate and regular rhythm.     Heart sounds: Normal heart sounds.  Pulmonary:     Effort: Pulmonary effort is normal.     Breath sounds: Normal breath sounds. No wheezing.  Chest:  Breasts:    Right: No mass.     Left: No mass.  Abdominal:     General: Bowel sounds are normal.     Palpations: Abdomen is soft.     Tenderness: There is no abdominal tenderness.  Musculoskeletal:        General: Normal range of motion.     Cervical back: Normal range of motion and neck supple.  Lymphadenopathy:     Cervical: No cervical adenopathy.  Skin:    General: Skin is warm and dry.  Neurological:     Mental Status: He is alert and oriented to person, place, and time.     Deep Tendon Reflexes: Reflexes are  normal and symmetric.  Psychiatric:        Attention and Perception: Attention normal.        Mood and Affect: Mood normal.        Thought Content: Thought content normal.  Wt Readings from Last 3 Encounters:  08/03/21 272 lb 9.6 oz (123.7 kg)  06/28/21 273 lb (123.8 kg)  11/02/20 257 lb (116.6 kg)    There were no vitals taken for this visit.  Assessment and Plan:  Problem List Items Addressed This Visit       Unprioritized   Prediabetes (Chronic)    Glucoses managed with diet alone. Lab Results  Component Value Date   HGBA1C 6.3 (H) 06/28/2021         Mixed hyperlipidemia (Chronic)    Working with diet changes only. Lab Results  Component Value Date   LDLCALC 146 (H) 12/27/2017         Other Visit Diagnoses     Annual physical exam    -  Primary   Colon cancer screening       Prostate cancer screening           No follow-ups on file.    Reubin Milan, MD [facility] [place]

## 2023-02-14 ENCOUNTER — Other Ambulatory Visit: Payer: Self-pay | Admitting: Internal Medicine

## 2023-02-14 DIAGNOSIS — N5201 Erectile dysfunction due to arterial insufficiency: Secondary | ICD-10-CM

## 2023-05-16 ENCOUNTER — Telehealth: Payer: Self-pay

## 2023-05-16 ENCOUNTER — Other Ambulatory Visit: Payer: Self-pay | Admitting: Internal Medicine

## 2023-05-16 DIAGNOSIS — N5201 Erectile dysfunction due to arterial insufficiency: Secondary | ICD-10-CM

## 2023-05-16 NOTE — Telephone Encounter (Signed)
Requested medication (s) are due for refill today: yes  Requested medication (s) are on the active medication list: yes  Last refill:  02/14/23  Future visit scheduled: no  Notes to clinic:  Unable to refill per protocol, courtesy refill already given, routing for provider approval.      Requested Prescriptions  Pending Prescriptions Disp Refills   sildenafil (REVATIO) 20 MG tablet [Pharmacy Med Name: SILDENAFIL 20 MG TABLET] 60 tablet 0    Sig: TAKE 1-2 TABLETS BY MOUTH DAILY AS NEEDED     Urology: Erectile Dysfunction Agents Failed - 05/16/2023  9:50 AM      Failed - AST in normal range and within 360 days    AST  Date Value Ref Range Status  06/28/2021 19 0 - 40 IU/L Final         Failed - ALT in normal range and within 360 days    ALT  Date Value Ref Range Status  06/28/2021 29 0 - 44 IU/L Final         Failed - Last BP in normal range    BP Readings from Last 1 Encounters:  08/03/21 (!) 138/96         Failed - Valid encounter within last 12 months    Recent Outpatient Visits           1 year ago Erroneous encounter - disregard   [facility] at [facility], Nyoka Cowden, MD   1 year ago Bilateral lower extremity edema   [place] [facility] at [facility], Nyoka Cowden, MD   2 years ago Prediabetes   [facility] at [facility], Nyoka Cowden, MD   3 years ago Prediabetes   [facility] at [facility], Nyoka Cowden, MD   3 years ago Polyarthritis   [facility] at [facility], Nyoka Cowden, MD

## 2023-05-16 NOTE — Telephone Encounter (Signed)
Patient will call back to set up appointment at a later date.

## 2023-05-16 NOTE — Telephone Encounter (Signed)
Please schedule a appt for ED medications.  KP

## 2023-07-03 DIAGNOSIS — S76111A Strain of right quadriceps muscle, fascia and tendon, initial encounter: Secondary | ICD-10-CM | POA: Insufficient documentation

## 2023-07-29 HISTORY — PX: QUADRICEPS TENDON REPAIR: SHX756

## 2023-09-04 ENCOUNTER — Other Ambulatory Visit: Payer: Self-pay | Admitting: Internal Medicine

## 2023-09-04 DIAGNOSIS — N5201 Erectile dysfunction due to arterial insufficiency: Secondary | ICD-10-CM

## 2023-09-05 ENCOUNTER — Other Ambulatory Visit: Payer: Self-pay | Admitting: Orthopaedic Surgery

## 2023-09-05 ENCOUNTER — Ambulatory Visit
Admission: RE | Admit: 2023-09-05 | Discharge: 2023-09-05 | Disposition: A | Discharge status: Home / Self Care | Payer: Worker's Compensation | Source: Ambulatory Visit | Attending: Orthopaedic Surgery | Admitting: Orthopaedic Surgery

## 2023-09-05 DIAGNOSIS — R2241 Localized swelling, mass and lump, right lower limb: Secondary | ICD-10-CM

## 2023-09-05 DIAGNOSIS — M7989 Other specified soft tissue disorders: Secondary | ICD-10-CM | POA: Insufficient documentation

## 2023-09-06 NOTE — Telephone Encounter (Signed)
 Requested medication (s) are due for refill today: yes  Requested medication (s) are on the active medication list: yes  Last refill:  02/14/23 #60  Future visit scheduled: no  Notes to clinic:  sent pt message to make appt. Pt has many reminders and pt has many cancellations and no shows//overdue lab work   Requested Prescriptions  Pending Prescriptions Disp Refills   sildenafil  (REVATIO ) 20 MG tablet [Pharmacy Med Name: SILDENAFIL  20 MG TABLET] 60 tablet 0    Sig: TAKE 1-2 TABLETS BY MOUTH DAILY AS NEEDED     Urology: Erectile Dysfunction Agents Failed - 09/06/2023  1:09 PM      Failed - AST in normal range and within 360 days    AST  Date Value Ref Range Status  06/28/2021 19 0 - 40 IU/L Final         Failed - ALT in normal range and within 360 days    ALT  Date Value Ref Range Status  06/28/2021 29 0 - 44 IU/L Final         Failed - Last BP in normal range    BP Readings from Last 1 Encounters:  08/03/21 (!) 138/96         Failed - Valid encounter within last 12 months    Recent Outpatient Visits   None

## 2023-09-13 ENCOUNTER — Telehealth: Payer: Self-pay

## 2023-09-13 ENCOUNTER — Encounter: Payer: Self-pay | Admitting: Family

## 2023-09-13 NOTE — Telephone Encounter (Signed)
 Copied from CRM [id number]. Topic: Clinical - Medication Question >> Sep 12, 2023  2:44 PM Geneva B wrote: Reason for CRM: patient wants to know if he can get some samples of  sildenafil  (REVATIO ) 20 MG tablet  until insurance comes back on next month

## 2023-09-13 NOTE — Telephone Encounter (Signed)
 MyChart message sent to patient.

## 2023-09-16 ENCOUNTER — Other Ambulatory Visit: Payer: Self-pay | Admitting: Family

## 2023-09-16 DIAGNOSIS — S335XXA Sprain of ligaments of lumbar spine, initial encounter: Secondary | ICD-10-CM

## 2023-09-21 ENCOUNTER — Ambulatory Visit
Admission: RE | Admit: 2023-09-21 | Discharge: 2023-09-21 | Disposition: A | Discharge status: Home / Self Care | Payer: Worker's Compensation | Source: Ambulatory Visit | Attending: Family | Admitting: Family

## 2023-09-21 DIAGNOSIS — S335XXA Sprain of ligaments of lumbar spine, initial encounter: Secondary | ICD-10-CM

## 2023-10-21 ENCOUNTER — Other Ambulatory Visit: Payer: Self-pay | Admitting: Orthopaedic Surgery

## 2023-10-21 DIAGNOSIS — S76111D Strain of right quadriceps muscle, fascia and tendon, subsequent encounter: Secondary | ICD-10-CM

## 2023-10-22 ENCOUNTER — Encounter: Payer: Self-pay | Admitting: Internal Medicine

## 2023-10-23 ENCOUNTER — Encounter: Payer: Self-pay | Admitting: Internal Medicine

## 2023-10-23 NOTE — Progress Notes (Deleted)
 Date:  10/23/2023   Name:  Caleb Duncan   DOB:  07-Feb-1976   MRN:  [id number]   Chief Complaint: No chief complaint on file. Caleb Duncan is a 48 y.o. male who presents today for his Complete Annual Exam. He feels {DESC; WELL/FAIRLY WELL/POORLY:18703}. He reports exercising ***. He reports he is sleeping {DESC; WELL/FAIRLY WELL/POORLY:18703}.   Health Maintenance  Topic Date Due   HIV Screening  Never done   Hepatitis B Vaccine (1 of 3 - 19+ 3-dose series) Never done   Colon Cancer Screening  Never done   Flu Shot  11/29/2023   DTaP/Tdap/Td vaccine (2 - Td or Tdap) 12/28/2027   Hepatitis C Screening  Completed   HPV Vaccine  Aged Out   Meningitis B Vaccine  Aged Out   COVID-19 Vaccine  Discontinued    Lab Results  Component Value Date   PSA1 0.5 11/02/2020   PSA1 0.4 12/27/2017    HPI  Review of Systems  Constitutional:  Negative for fatigue and unexpected weight change.  HENT:  Negative for nosebleeds.   Eyes:  Negative for visual disturbance.  Respiratory:  Negative for cough, chest tightness, shortness of breath and wheezing.   Cardiovascular:  Negative for chest pain, palpitations and leg swelling.  Gastrointestinal:  Negative for abdominal pain, constipation and diarrhea.  Neurological:  Negative for dizziness, weakness, light-headedness and headaches.     Lab Results  Component Value Date   NA 140 06/28/2021   K 5.0 06/28/2021   CO2 21 06/28/2021   GLUCOSE 91 06/28/2021   BUN 13 06/28/2021   CREATININE 0.98 06/28/2021   CALCIUM 9.5 06/28/2021   EGFR 96 06/28/2021   GFRNONAA >60 03/24/2019   Lab Results  Component Value Date   CHOL 239 (H) 12/27/2017   HDL 40 12/27/2017   LDLCALC 146 (H) 12/27/2017   TRIG 267 (H) 12/27/2017   CHOLHDL 6.0 (H) 12/27/2017   Lab Results  Component Value Date   TSH 2.020 06/28/2021   Lab Results  Component Value Date   HGBA1C 6.3 (H) 06/28/2021   Lab Results  Component Value Date   WBC 6.9 06/28/2021   HGB  15.1 06/28/2021   HCT 45.1 06/28/2021   MCV 90 06/28/2021   PLT 302 06/28/2021   Lab Results  Component Value Date   ALT 29 06/28/2021   AST 19 06/28/2021   ALKPHOS 77 06/28/2021   BILITOT <0.2 06/28/2021   No results found for: MARIEN BOLLS, VD25OH   Patient Active Problem List   Diagnosis Date Noted   Traumatic rupture of right quadriceps tendon 07/03/2023   Bilateral lower extremity edema 06/28/2021   Prediabetes 11/11/2019   Mixed hyperlipidemia 12/28/2017   Gastroesophageal reflux disease 08/23/2017   Urinary frequency 08/23/2017   Erectile dysfunction 08/23/2017   Sciatica 08/23/2017    No Known Allergies  Past Surgical History:  Procedure Laterality Date   ANKLE SURGERY Right 1999   QUADRICEPS TENDON REPAIR Right 07/29/2023   TONSILLECTOMY      Social History   Tobacco Use   Smoking status: Never   Smokeless tobacco: Never  Vaping Use   Vaping status: Some Days  Substance Use Topics   Alcohol use: Yes    Alcohol/week: 6.0 standard drinks of alcohol    Types: 4 Cans of beer, 2 Shots of liquor per week   Drug use: Yes    Frequency: 1.0 times per week    Types: Marijuana     Medication  list has been reviewed and updated.  No outpatient medications have been marked as taking for the 10/23/23 encounter (Appointment) with Justus Leita DEL, MD.       06/28/2021   10:22 AM 11/02/2020    4:03 PM 11/11/2019    2:20 PM  GAD 7 : Generalized Anxiety Score  Nervous, Anxious, on Edge 0 0 0  Control/stop worrying 0 0 0  Worry too much - different things 0 0 0  Trouble relaxing 0 0 0  Restless 0 0 0  Easily annoyed or irritable 0 0 0  Afraid - awful might happen 0 0 0  Total GAD 7 Score 0 0 0  Anxiety Difficulty Not difficult at all Not difficult at all Not difficult at all       06/28/2021   10:22 AM 11/02/2020    4:03 PM 11/11/2019    2:19 PM  Depression screen PHQ 2/9  Decreased Interest 0 0 0  Down, Depressed, Hopeless 0 0 0  PHQ - 2  Score 0 0 0  Altered sleeping 0 0 0  Tired, decreased energy 2 0 0  Change in appetite 0 0 0  Feeling bad or failure about yourself  0 0 0  Trouble concentrating 0 0 0  Moving slowly or fidgety/restless 0 0 0  Suicidal thoughts 0 0 0  PHQ-9 Score 2 0 0  Difficult doing work/chores Not difficult at all Not difficult at all Not difficult at all    BP Readings from Last 3 Encounters:  08/03/21 (!) 138/96  06/28/21 120/82  11/02/20 112/82    Physical Exam Vitals and nursing note reviewed.  Constitutional:      Appearance: Normal appearance. He is well-developed.  HENT:     Head: Normocephalic.     Right Ear: Tympanic membrane, ear canal and external ear normal.     Left Ear: Tympanic membrane, ear canal and external ear normal.     Nose: Nose normal.   Eyes:     Conjunctiva/sclera: Conjunctivae normal.     Pupils: Pupils are equal, round, and reactive to light.   Neck:     Thyroid: No thyromegaly.     Vascular: No carotid bruit.   Cardiovascular:     Rate and Rhythm: Normal rate and regular rhythm.     Heart sounds: Normal heart sounds.  Pulmonary:     Effort: Pulmonary effort is normal.     Breath sounds: Normal breath sounds. No wheezing.  Chest:  Breasts:    Right: No mass.     Left: No mass.  Abdominal:     General: Bowel sounds are normal.     Palpations: Abdomen is soft.     Tenderness: There is no abdominal tenderness.   Musculoskeletal:        General: Normal range of motion.     Cervical back: Normal range of motion and neck supple.  Lymphadenopathy:     Cervical: No cervical adenopathy.   Skin:    General: Skin is warm and dry.   Neurological:     Mental Status: He is alert and oriented to person, place, and time.     Deep Tendon Reflexes: Reflexes are normal and symmetric.   Psychiatric:        Attention and Perception: Attention normal.        Mood and Affect: Mood normal.        Thought Content: Thought content normal.     Wt Readings  from Last  3 Encounters:  08/03/21 272 lb 9.6 oz (123.7 kg)  06/28/21 273 lb (123.8 kg)  11/02/20 257 lb (116.6 kg)    There were no vitals taken for this visit.  Assessment and Plan:  Problem List Items Addressed This Visit       Unprioritized   Gastroesophageal reflux disease (Chronic)   Mixed hyperlipidemia (Chronic)   Prediabetes (Chronic)   Other Visit Diagnoses       Annual physical exam    -  Primary     Colon cancer screening         Prostate cancer screening           No follow-ups on file.    Leita HILARIO Adie, MD [facility] [place]

## 2023-10-25 ENCOUNTER — Ambulatory Visit
Admission: RE | Admit: 2023-10-25 | Discharge: 2023-10-25 | Disposition: A | Discharge status: Home / Self Care | Payer: Worker's Compensation | Source: Ambulatory Visit | Attending: Orthopaedic Surgery

## 2023-10-25 DIAGNOSIS — S76111D Strain of right quadriceps muscle, fascia and tendon, subsequent encounter: Secondary | ICD-10-CM

## 2023-12-31 ENCOUNTER — Ambulatory Visit (HOSPITAL_BASED_OUTPATIENT_CLINIC_OR_DEPARTMENT_OTHER): Payer: Self-pay | Admitting: Physician Assistant

## 2024-03-02 ENCOUNTER — Encounter: Payer: Self-pay | Admitting: Radiology

## 2024-06-01 ENCOUNTER — Encounter: Payer: Self-pay | Admitting: Family Medicine

## 2024-06-09 ENCOUNTER — Encounter: Payer: Self-pay | Admitting: Family Medicine

## 2024-06-24 ENCOUNTER — Encounter: Payer: Self-pay | Admitting: Family Medicine
